# Patient Record
Sex: Female | Born: 1940
Health system: Southern US, Community
[De-identification: ages and names within clinical notes are randomized; demographics above are authoritative.]

## PROBLEM LIST (undated history)

## (undated) DIAGNOSIS — Z9889 Other specified postprocedural states: Secondary | ICD-10-CM

## (undated) DIAGNOSIS — F329 Major depressive disorder, single episode, unspecified: Secondary | ICD-10-CM

## (undated) DIAGNOSIS — G4730 Sleep apnea, unspecified: Secondary | ICD-10-CM

## (undated) DIAGNOSIS — K219 Gastro-esophageal reflux disease without esophagitis: Secondary | ICD-10-CM

## (undated) DIAGNOSIS — I10 Essential (primary) hypertension: Secondary | ICD-10-CM

## (undated) DIAGNOSIS — E119 Type 2 diabetes mellitus without complications: Secondary | ICD-10-CM

## (undated) DIAGNOSIS — M1990 Unspecified osteoarthritis, unspecified site: Secondary | ICD-10-CM

## (undated) DIAGNOSIS — R112 Nausea with vomiting, unspecified: Secondary | ICD-10-CM

## (undated) DIAGNOSIS — K5792 Diverticulitis of intestine, part unspecified, without perforation or abscess without bleeding: Secondary | ICD-10-CM

## (undated) DIAGNOSIS — Z87442 Personal history of urinary calculi: Secondary | ICD-10-CM

## (undated) DIAGNOSIS — F32A Depression, unspecified: Secondary | ICD-10-CM

## (undated) DIAGNOSIS — F419 Anxiety disorder, unspecified: Secondary | ICD-10-CM

## (undated) HISTORY — PX: ABDOMINAL HYSTERECTOMY: SHX81

## (undated) HISTORY — PX: COLONOSCOPY: SHX174

## (undated) HISTORY — PX: TONSILLECTOMY: SUR1361

## (undated) HISTORY — PX: KNEE ARTHROSCOPY: SUR90

## (undated) HISTORY — PX: CHOLECYSTECTOMY: SHX55

---

## 2018-03-07 ENCOUNTER — Other Ambulatory Visit: Payer: Self-pay | Admitting: Neurosurgery

## 2018-03-07 DIAGNOSIS — M4316 Spondylolisthesis, lumbar region: Secondary | ICD-10-CM

## 2018-03-22 ENCOUNTER — Ambulatory Visit
Admission: RE | Admit: 2018-03-22 | Discharge: 2018-03-22 | Disposition: A | Discharge status: Home / Self Care | Payer: 59 | Source: Ambulatory Visit | Attending: Neurosurgery | Admitting: Neurosurgery

## 2018-03-22 DIAGNOSIS — M4316 Spondylolisthesis, lumbar region: Secondary | ICD-10-CM

## 2018-04-07 ENCOUNTER — Other Ambulatory Visit: Payer: Self-pay | Admitting: Neurosurgery

## 2018-05-01 ENCOUNTER — Other Ambulatory Visit: Payer: Self-pay

## 2018-05-01 ENCOUNTER — Encounter (HOSPITAL_COMMUNITY): Payer: Self-pay | Admitting: Physician Assistant

## 2018-05-01 ENCOUNTER — Encounter (HOSPITAL_COMMUNITY): Payer: Self-pay | Admitting: Emergency Medicine

## 2018-05-01 ENCOUNTER — Encounter (HOSPITAL_COMMUNITY): Payer: Self-pay

## 2018-05-01 ENCOUNTER — Emergency Department (HOSPITAL_COMMUNITY): Payer: Medicare HMO

## 2018-05-01 ENCOUNTER — Encounter (HOSPITAL_COMMUNITY)
Admission: RE | Admit: 2018-05-01 | Discharge: 2018-05-01 | Disposition: A | Discharge status: Home / Self Care | Payer: Medicare HMO | Source: Ambulatory Visit | Attending: Neurosurgery | Admitting: Neurosurgery

## 2018-05-01 ENCOUNTER — Inpatient Hospital Stay (HOSPITAL_COMMUNITY)
Admission: EM | Admit: 2018-05-01 | Discharge: 2018-05-03 | DRG: 310 | Disposition: A | Discharge status: Home / Self Care | Payer: Medicare HMO | Attending: Family Medicine | Admitting: Family Medicine

## 2018-05-01 DIAGNOSIS — F329 Major depressive disorder, single episode, unspecified: Secondary | ICD-10-CM | POA: Diagnosis present

## 2018-05-01 DIAGNOSIS — I4891 Unspecified atrial fibrillation: Principal | ICD-10-CM | POA: Diagnosis present

## 2018-05-01 DIAGNOSIS — E785 Hyperlipidemia, unspecified: Secondary | ICD-10-CM | POA: Diagnosis present

## 2018-05-01 DIAGNOSIS — Z01818 Encounter for other preprocedural examination: Secondary | ICD-10-CM | POA: Insufficient documentation

## 2018-05-01 DIAGNOSIS — Z79899 Other long term (current) drug therapy: Secondary | ICD-10-CM | POA: Diagnosis not present

## 2018-05-01 DIAGNOSIS — M549 Dorsalgia, unspecified: Secondary | ICD-10-CM | POA: Diagnosis present

## 2018-05-01 DIAGNOSIS — I119 Hypertensive heart disease without heart failure: Secondary | ICD-10-CM | POA: Diagnosis present

## 2018-05-01 DIAGNOSIS — M1990 Unspecified osteoarthritis, unspecified site: Secondary | ICD-10-CM | POA: Diagnosis present

## 2018-05-01 DIAGNOSIS — Z8249 Family history of ischemic heart disease and other diseases of the circulatory system: Secondary | ICD-10-CM

## 2018-05-01 DIAGNOSIS — Z885 Allergy status to narcotic agent status: Secondary | ICD-10-CM

## 2018-05-01 DIAGNOSIS — K219 Gastro-esophageal reflux disease without esophagitis: Secondary | ICD-10-CM | POA: Diagnosis present

## 2018-05-01 DIAGNOSIS — Z6834 Body mass index (BMI) 34.0-34.9, adult: Secondary | ICD-10-CM | POA: Diagnosis not present

## 2018-05-01 DIAGNOSIS — E669 Obesity, unspecified: Secondary | ICD-10-CM | POA: Diagnosis present

## 2018-05-01 DIAGNOSIS — E876 Hypokalemia: Secondary | ICD-10-CM | POA: Diagnosis present

## 2018-05-01 DIAGNOSIS — F419 Anxiety disorder, unspecified: Secondary | ICD-10-CM | POA: Diagnosis present

## 2018-05-01 DIAGNOSIS — D72829 Elevated white blood cell count, unspecified: Secondary | ICD-10-CM | POA: Diagnosis present

## 2018-05-01 DIAGNOSIS — G4733 Obstructive sleep apnea (adult) (pediatric): Secondary | ICD-10-CM | POA: Diagnosis present

## 2018-05-01 DIAGNOSIS — Z87442 Personal history of urinary calculi: Secondary | ICD-10-CM

## 2018-05-01 DIAGNOSIS — E119 Type 2 diabetes mellitus without complications: Secondary | ICD-10-CM | POA: Insufficient documentation

## 2018-05-01 DIAGNOSIS — E1165 Type 2 diabetes mellitus with hyperglycemia: Secondary | ICD-10-CM | POA: Diagnosis present

## 2018-05-01 DIAGNOSIS — Z794 Long term (current) use of insulin: Secondary | ICD-10-CM | POA: Diagnosis not present

## 2018-05-01 DIAGNOSIS — Z23 Encounter for immunization: Secondary | ICD-10-CM | POA: Diagnosis present

## 2018-05-01 DIAGNOSIS — I10 Essential (primary) hypertension: Secondary | ICD-10-CM

## 2018-05-01 HISTORY — DX: Gastro-esophageal reflux disease without esophagitis: K21.9

## 2018-05-01 HISTORY — DX: Essential (primary) hypertension: I10

## 2018-05-01 HISTORY — DX: Type 2 diabetes mellitus without complications: E11.9

## 2018-05-01 HISTORY — DX: Sleep apnea, unspecified: G47.30

## 2018-05-01 HISTORY — DX: Anxiety disorder, unspecified: F41.9

## 2018-05-01 HISTORY — DX: Unspecified osteoarthritis, unspecified site: M19.90

## 2018-05-01 HISTORY — DX: Major depressive disorder, single episode, unspecified: F32.9

## 2018-05-01 HISTORY — DX: Depression, unspecified: F32.A

## 2018-05-01 HISTORY — DX: Diverticulitis of intestine, part unspecified, without perforation or abscess without bleeding: K57.92

## 2018-05-01 HISTORY — DX: Personal history of urinary calculi: Z87.442

## 2018-05-01 LAB — BASIC METABOLIC PANEL
Anion gap: 13 (ref 5–15)
Anion gap: 13 (ref 5–15)
BUN: 11 mg/dL (ref 8–23)
BUN: 11 mg/dL (ref 8–23)
CALCIUM: 9.4 mg/dL (ref 8.9–10.3)
CHLORIDE: 100 mmol/L (ref 98–111)
CO2: 25 mmol/L (ref 22–32)
CO2: 23 mmol/L (ref 22–32)
Calcium: 9.6 mg/dL (ref 8.9–10.3)
Chloride: 100 mmol/L (ref 98–111)
Creatinine, Ser: 1 mg/dL (ref 0.44–1.00)
Creatinine, Ser: 0.89 mg/dL (ref 0.44–1.00)
GFR calc Af Amer: >60 mL/min (ref >60)
GFR calc Af Amer: >60 mL/min (ref >60)
GFR calc non Af Amer: 55 mL/min — ABNORMAL LOW (ref >60)
GFR calc non Af Amer: >60 mL/min (ref >60)
Glucose, Bld: 313 mg/dL — ABNORMAL HIGH (ref 70–99)
Glucose, Bld: 312 mg/dL — ABNORMAL HIGH (ref 70–99)
Potassium: 3.3 mmol/L — ABNORMAL LOW (ref 3.5–5.1)
Potassium: 3.1 mmol/L — ABNORMAL LOW (ref 3.5–5.1)
Sodium: 138 mmol/L (ref 135–145)
Sodium: 136 mmol/L (ref 135–145)

## 2018-05-01 LAB — CBC
HCT: 50.2 % — ABNORMAL HIGH (ref 36.0–46.0)
HCT: 48.3 % — ABNORMAL HIGH (ref 36.0–46.0)
Hemoglobin: 16.1 g/dL — ABNORMAL HIGH (ref 12.0–15.0)
Hemoglobin: 15.9 g/dL — ABNORMAL HIGH (ref 12.0–15.0)
MCH: 29.6 pg (ref 26.0–34.0)
MCH: 29.8 pg (ref 26.0–34.0)
MCHC: 32.1 g/dL (ref 30.0–36.0)
MCHC: 32.9 g/dL (ref 30.0–36.0)
MCV: 92.3 fL (ref 80.0–100.0)
MCV: 90.4 fL (ref 80.0–100.0)
Platelets: 238 10*3/uL (ref 150–400)
Platelets: 236 10*3/uL (ref 150–400)
RBC: 5.44 MIL/uL — ABNORMAL HIGH (ref 3.87–5.11)
RBC: 5.34 MIL/uL — ABNORMAL HIGH (ref 3.87–5.11)
RDW: 12.4 % (ref 11.5–15.5)
RDW: 12.5 % (ref 11.5–15.5)
WBC: 18.5 10*3/uL — AB (ref 4.0–10.5)
WBC: 17.5 10*3/uL — ABNORMAL HIGH (ref 4.0–10.5)
nRBC: 0 % (ref 0.0–0.2)
nRBC: 0 % (ref 0.0–0.2)

## 2018-05-01 LAB — TYPE AND SCREEN
ABO/RH(D): A POS
Antibody Screen: NEGATIVE

## 2018-05-01 LAB — ABO/RH: ABO/RH(D): A POS

## 2018-05-01 LAB — SURGICAL PCR SCREEN
MRSA, PCR: NEGATIVE
Staphylococcus aureus: NEGATIVE

## 2018-05-01 LAB — GLUCOSE, CAPILLARY: Glucose-Capillary: 279 mg/dL — ABNORMAL HIGH (ref 70–99)

## 2018-05-01 LAB — I-STAT TROPONIN, ED: Troponin i, poc: 0.02 ng/mL (ref 0.00–0.08)

## 2018-05-01 LAB — HEMOGLOBIN A1C
Hgb A1c MFr Bld: 11.8 % — ABNORMAL HIGH (ref 4.8–5.6)
Mean Plasma Glucose: 291.96 mg/dL

## 2018-05-01 MED ORDER — APIXABAN 5 MG PO TABS
5.0000 mg | ORAL_TABLET | Freq: Two times a day (BID) | ORAL | Status: DC
  Administered 2018-05-02 – 2018-05-03 (×4): 5 mg via ORAL
  Filled 2018-05-01 (×5): qty 1

## 2018-05-01 MED ORDER — POTASSIUM CHLORIDE CRYS ER 20 MEQ PO TBCR
40.0000 meq | EXTENDED_RELEASE_TABLET | Freq: Once | ORAL | Status: AC
  Administered 2018-05-01: 40 meq via ORAL
  Filled 2018-05-01: qty 2

## 2018-05-01 MED ORDER — SODIUM CHLORIDE 0.9% FLUSH
3.0000 mL | Freq: Once | INTRAVENOUS | Status: AC
  Administered 2018-05-01: 3 mL via INTRAVENOUS

## 2018-05-01 MED ORDER — DILTIAZEM HCL 25 MG/5ML IV SOLN
10.0000 mg | Freq: Once | INTRAVENOUS | Status: AC
  Administered 2018-05-01: 10 mg via INTRAVENOUS
  Filled 2018-05-01: qty 5

## 2018-05-01 MED ORDER — DILTIAZEM HCL-DEXTROSE 100-5 MG/100ML-% IV SOLN (PREMIX)
5.0000 mg/h | INTRAVENOUS | Status: DC
  Administered 2018-05-01 – 2018-05-02 (×2): 5 mg/h via INTRAVENOUS
  Filled 2018-05-01 (×2): qty 100

## 2018-05-01 NOTE — Discharge Instructions (Addendum)
° °  For stress test, no caffeine for 48 hours prior to test,  nothing to eat or drink for 3 hours prior to study.  You will not walk for this test.       Information on my medicine - ELIQUIS (apixaban)  Why was Eliquis prescribed for you? Eliquis was prescribed for you to reduce the risk of a blood clot forming that can cause a stroke if you have a medical condition called atrial fibrillation (a type of irregular heartbeat).  What do You need to know about Eliquis ? Take your Eliquis TWICE DAILY - one tablet in the morning and one tablet in the evening with or without food. If you have difficulty swallowing the tablet whole please discuss with your pharmacist how to take the medication safely.  Take Eliquis exactly as prescribed by your doctor and DO NOT stop taking Eliquis without talking to the doctor who prescribed the medication.  Stopping may increase your risk of developing a stroke.  Refill your prescription before you run out.  After discharge, you should have regular check-up appointments with your healthcare provider that is prescribing your Eliquis.  In the future your dose may need to be changed if your kidney function or weight changes by a significant amount or as you get older.  What do you do if you miss a dose? If you miss a dose, take it as soon as you remember on the same day and resume taking twice daily.  Do not take more than one dose of ELIQUIS at the same time to make up a missed dose.  Important Safety Information A possible side effect of Eliquis is bleeding. You should call your healthcare provider right away if you experience any of the following: ? Bleeding from an injury or your nose that does not stop. ? Unusual colored urine (red or dark brown) or unusual colored stools (red or black). ? Unusual bruising for unknown reasons. ? A serious fall or if you hit your head (even if there is no bleeding).  Some medicines may interact with Eliquis and might  increase your risk of bleeding or clotting while on Eliquis. To help avoid this, consult your healthcare provider or pharmacist prior to using any new prescription or non-prescription medications, including herbals, vitamins, non-steroidal anti-inflammatory drugs (NSAIDs) and supplements.  This website has more information on Eliquis (apixaban): http://www.eliquis.com/eliquis/home

## 2018-05-01 NOTE — Progress Notes (Signed)
PCP - [name], NP at [facility] in [place], [place] Cardiologist - denies  Chest x-ray - n/a EKG - today  Stress Test - over 10 years ago (Dr. Rockne Menghini in [place]) will request records ECHO - over 10 years ago (will request records) Cardiac Cath - denies   Sleep Study - 5 years ago, some place in [place], [place] CPAP - states she can't tolerate a Cpap  Fasting Blood Sugar - approx 200 Checks Blood Sugar _2-3 ____ times a day Pt's CBG was 267, she ate lunch around 1:30 PM (had fries only).   Blood Thinner Instructions: N/a Aspirin Instructions: N/a  Anesthesia review: yes - when pt arrived in my office for her PAT appt she stated that she wasn't feeling well, stating that she was having abd pain. She states she's been having pain for a "day or two". States that the pain is low abd and she thinks she's been constipated the last few days. She states she did have a BM just prior to coming in for the appt. She states she is also nauseated. I asked her if she needed an emesis basis, but she stated that she didn't think so. I told her that I had a trash can if she needed it. Just a minute or so after telling her that, she requested it and she vomited several times, thick emesis. After vomiting, she stated she felt better and wanted to go on with the PAT interview. She continued to state that she felt better several times during the interview. Pt did say she got very "winded" and "worn out" walking from the parking deck. Finished the interview and pt states that the nausea was totally gone. EKG done and it showed A-fib. Pt states she has never been diagnosed with a-fib in the past. Antionette Poles, PA was notified and he saw pt at the bedside in the EKG room. Rapid response was notified and Verlon Au, RN came and transported pt to the ED.    Patient denies shortness of breath, fever, cough and chest pain at PAT appointment   Patient verbalized understanding of instructions that were given to them at the PAT  appointment. Patient was also instructed that they will need to review over the PAT instructions again at home before surgery.

## 2018-05-01 NOTE — Progress Notes (Signed)
ANTICOAGULATION CONSULT NOTE - Initial Consult  Pharmacy Consult for apixaban Indication: atrial fibrillation  Allergies  Allergen Reactions  . Codeine Nausea And Vomiting  . Propoxyphene Nausea And Vomiting    Darvocet    Patient Measurements: Wt: 93.4 kg Ht: 5'5"  Vital Signs: Temp: 98.7 F (37.1 C) (02/27 1647) Temp Source: Oral (02/27 1647) BP: 105/73 (02/27 2015) Pulse Rate: 78 (02/27 2015)  Labs: Recent Labs    05/01/18 1607 05/01/18 1657  HGB 16.1* 15.9*  HCT 50.2* 48.3*  PLT 238 236  CREATININE 1.00 0.89    Estimated Creatinine Clearance: 61.7 mL/min (by C-G formula based on SCr of 0.89 mg/dL).   Medical History: Past Medical History:  Diagnosis Date  . Anxiety   . Arthritis   . Depression   . Diabetes mellitus without complication (HCC)    type 2  . Diverticulitis   . GERD (gastroesophageal reflux disease)   . History of kidney stones   . Hypertension   . Sleep apnea    does not tolerate Cpap   Assessment: 77 yof found to be in new onset Afib during evaluation for preoperative clearance for lumbar surgery. No anticoag PTA.   Hgb 15.9, plt 236. Troponin 0.02. No s/sx of bleeding.  Will dose at full dose given age<80, wt>60 kg, and Scr<1.5.   Goal of Therapy:  Monitor platelets by anticoagulation protocol: Yes   Plan:  Apixaban 5 mg twice daily Monitor CBC and for s/sx of bleeding  Sherron Monday, PharmD, BCCCP Clinical Pharmacist  Pager: [phone] Phone: [phone] 05/01/2018,8:23 PM

## 2018-05-01 NOTE — Progress Notes (Signed)
Called to see pt as she reported not feeling well and had an episode of emesis in PAT. She has been having recent abdominal pain. EKG revealed afib with RVR ventricular rate ~119 which is new for patient. She reports feeling fatigued and a little short of breath with walking. She denies any chest pain. She denies any cardiac history, says she had a stress test ~15yrs ago in [place] that was normal. Cardiology was called and patient was transport to ED for further workup and management.   Zannie Cove [facility] Short Stay Center/Anesthesiology Phone [phone] 05/01/2018 4:46 PM

## 2018-05-01 NOTE — Pre-Procedure Instructions (Signed)
Natasha Rosales  05/01/2018    Your procedure is scheduled on Thursday, May 09, 2018 at 12:15 PM.   Report to [facility] Entrance "A" Admitting Office at 10:15 AM.   Call this number if you have problems the morning of surgery: [phone]   Questions prior to day of surgery, please call [phone] between 8 & 4 PM.   Remember:  Do not eat or drink after midnight Wednesday, 05/08/18.  Take these medicines the morning of surgery with A SIP OF WATER: Amlodipine (Norvasc), Citalopram (Celexa), Lamotrigine (Lamictal), Pantoprazole (Protonix), Flonase - if needed  Do not use Aspirin products (BC powders, Goody's, etc), NSAIDS (Ibuprofen, Aleve, etc), Herbal medications or Multivitamins prior to surgery.  The evening before surgery and the morning of surgery, take 1/2 of your regular dose of Levemir Insulin. You will take 8 units.    How to Manage Your Diabetes Before Surgery   Why is it important to control my blood sugar before and after surgery?   Improving blood sugar levels before and after surgery helps healing and can limit problems.  A way of improving blood sugar control is eating a healthy diet by:  - Eating less sugar and carbohydrates  - Increasing activity/exercise  - Talk with your doctor about reaching your blood sugar goals  High blood sugars (greater than 180 mg/dL) can raise your risk of infections and slow down your recovery so you will need to focus on controlling your diabetes during the weeks before surgery.  Make sure that the doctor who takes care of your diabetes knows about your planned surgery including the date and location.  How do I manage my blood sugars before surgery?   Check your blood sugar at least 4 times a day, 2 days before surgery to make sure that they are not too high or low.  Check your blood sugar the morning of your surgery when you wake up and every 2 hours until you get to the Short-Stay unit.  Treat a low blood sugar  (less than 70 mg/dL) with 1/2 cup of clear juice (cranberry or apple), 4 glucose tablets, OR glucose gel.  Recheck blood sugar in 15 minutes after treatment (to make sure it is greater than 70 mg/dL).  If blood sugar is not greater than 70 mg/dL on re-check, call [phone] for further instructions.   Report your blood sugar to the Short-Stay nurse when you get to Short-Stay.  References:  [facility], 2007 "How to Manage your Diabetes Before and After Surgery".     Do not wear jewelry, make-up or nail polish.  Do not wear lotions, powders, perfumes or deodorant.  Do not shave 48 hours prior to surgery.  Men may shave face and neck.  Do not bring valuables to the hospital.  [facility] is not responsible for any belongings or valuables.  Contacts, dentures or bridgework may not be worn into surgery.  Leave your suitcase in the car.  After surgery it may be brought to your room.  For patients admitted to the hospital, discharge time will be determined by your treatment team.  [facility] - Preparing for Surgery  Before surgery, you can play an important role.  Because skin is not sterile, your skin needs to be as free of germs as possible.  You can reduce the number of germs on you skin by washing with CHG (chlorahexidine gluconate) soap before surgery.  CHG is an antiseptic cleaner which kills germs and  bonds with the skin to continue killing germs even after washing.  Oral Hygiene is also important in reducing the risk of infection.  Remember to brush your teeth with your regular toothpaste the morning of surgery.  Please DO NOT use if you have an allergy to CHG or antibacterial soaps.  If your skin becomes reddened/irritated stop using the CHG and inform your nurse when you arrive at Short Stay.  Do not shave (including legs and underarms) for at least 48 hours prior to the first CHG shower.  You may shave your face.  Please follow these instructions  carefully:   1.  Shower with CHG Soap the night before surgery and the morning of Surgery.  2.  If you choose to wash your hair, wash your hair first as usual with your normal shampoo.  3.  After you shampoo, rinse your hair and body thoroughly to remove the shampoo. 4.  Use CHG as you would any other liquid soap.  You can apply chg directly to the skin and wash gently with a      scrungie or washcloth.           5.  Apply the CHG Soap to your body ONLY FROM THE NECK DOWN.   Do not use on open wounds or open sores. Avoid contact with your eyes, ears, mouth and genitals (private parts).  Wash genitals (private parts) with your normal soap.  6.  Wash thoroughly, paying special attention to the area where your surgery will be performed.  7.  Thoroughly rinse your body with warm water from the neck down.  8.  DO NOT shower/wash with your normal soap after using and rinsing off the CHG Soap.  9.  Pat yourself dry with a clean towel.            10.  Wear clean pajamas.            11.  Place clean sheets on your bed the night of your first shower and do not sleep with pets.  Day of Surgery  Shower as above. Do not apply any lotions/deodorants the morning of surgery.   Please wear clean clothes to the hospital. Remember to brush your teeth with toothpaste.   Please read over the fact sheets that you were given.

## 2018-05-01 NOTE — Consult Note (Addendum)
Cardiology Consultation:   Patient ID: DERIONNA HUDMAN; [id number]; November 01, 1940   Admit date: 05/01/2018 Date of Consult: 05/01/2018  Primary Care Provider: Patient, No Pcp Per Primary Cardiologist: New to [facility]-Dr. Hilty  Patient Profile:   Natasha Rosales is a 78 y.o. female with a hx of anxiety, depression, DM 2, GERD and hypertension who is being seen today for the evaluation of new onset atrial fibrillation at the request of Dr. Rodena Medin.  History of Present Illness:   Natasha Rosales is a 78 year old female with a history stated above who presented to [facility] on 05/01/2018 after being evaluated during preoperative clearance for lumbar surgery with Dr. Venetia Maxon.  During work-up, she was found to be in new onset atrial fibrillation with heart rate up to the 130s.  She denies prior history of atrial fibrillation. She denies chest pain, shortness of breath or palpitations.  Given the above, she was sent to the emergency department for further evaluation.  She denies prior history of CAD although has a family hx of CAD in her son, sister and father. She denies tobacco, alcohol or illicit drug use.  Cardiology has been asked to consult given the above.  Past Medical History:  Diagnosis Date  . Anxiety   . Arthritis   . Depression   . Diabetes mellitus without complication (HCC)    type 2  . Diverticulitis   . GERD (gastroesophageal reflux disease)   . History of kidney stones   . Hypertension   . Sleep apnea    does not tolerate Cpap    Past Surgical History:  Procedure Laterality Date  . ABDOMINAL HYSTERECTOMY    . CHOLECYSTECTOMY    . COLONOSCOPY    . KNEE ARTHROSCOPY Left   . TONSILLECTOMY       Prior to Admission medications   Medication Sig Start Date End Date Taking? Authorizing Provider  amLODipine (NORVASC) 5 MG tablet Take 5 mg by mouth daily. 03/07/18   [provider]  citalopram (CELEXA) 40 MG tablet Take 40 mg by mouth daily. 03/07/18   [provider]    fluticasone (FLONASE) 50 MCG/ACT nasal spray Place 1 spray into both nostrils daily as needed for allergies. 11/27/17   [provider]  hydrochlorothiazide (HYDRODIURIL) 25 MG tablet Take 25 mg by mouth daily. 03/14/18   [provider]  lamoTRIgine (LAMICTAL) 25 MG tablet Take 25 mg by mouth 2 (two) times daily. 03/14/18   [provider]  LEVEMIR FLEXTOUCH 100 UNIT/ML Pen Inject 17 Units into the skin 2 (two) times daily. 03/12/18   [provider]  lisinopril (PRINIVIL,ZESTRIL) 40 MG tablet Take 40 mg by mouth daily. 03/18/18   [provider]  pantoprazole (PROTONIX) 40 MG tablet Take 40 mg by mouth daily before breakfast. 12/20/17   [provider]  Vitamin D, Ergocalciferol, (DRISDOL) 1.25 MG (50000 UT) CAPS capsule Take 50,000 Units by mouth 2 (two) times a week. Mondays & Fridays. 12/19/17   [provider]    Inpatient Medications: Scheduled Meds:  Continuous Infusions:  PRN Meds:   Allergies:    Allergies  Allergen Reactions  . Codeine Nausea And Vomiting  . Propoxyphene Nausea And Vomiting    Darvocet    Social History:   Social History   Socioeconomic History  . Marital status: Married    Spouse name: Not on file  . Number of children: Not on file  . Years of education: Not on file  . Highest education  level: Not on file  Occupational History  . Not on file  Social Needs  . Financial resource strain: Not on file  . Food insecurity:    Worry: Not on file    Inability: Not on file  . Transportation needs:    Medical: Not on file    Non-medical: Not on file  Tobacco Use  . Smoking status: Never Smoker  . Smokeless tobacco: Never Used  Substance and Sexual Activity  . Alcohol use: Never    Frequency: Never  . Drug use: Never  . Sexual activity: Not on file  Lifestyle  . Physical activity:    Days per week: Not on file    Minutes per session: Not on file  . Stress: Not on file  Relationships   . Social connections:    Talks on phone: Not on file    Gets together: Not on file    Attends religious service: Not on file    Active member of club or organization: Not on file    Attends meetings of clubs or organizations: Not on file    Relationship status: Not on file  . Intimate partner violence:    Fear of current or ex partner: Not on file    Emotionally abused: Not on file    Physically abused: Not on file    Forced sexual activity: Not on file  Other Topics Concern  . Not on file  Social History Narrative  . Not on file    Family History:   No family history on file. Family Status:  No family status information on file.    ROS:  Please see the history of present illness.  All other ROS reviewed and negative.     Physical Exam/Data:   Vitals:   05/01/18 1748 05/01/18 1800 05/01/18 1845 05/01/18 1900  BP: 122/69 112/79 (!) 114/98 114/79  Pulse: 98 98 (!) 101 (!) 124  Resp:  19 16 18   Temp:      TempSrc:      SpO2: 94% 92% 93% 94%   No intake or output data in the 24 hours ending 05/01/18 1937 There were no vitals filed for this visit. There is no height or weight on file to calculate BMI.   General: Well developed, well nourished, NAD Skin: Warm, dry, intact  Head: Normocephalic, atraumatic,  clear, moist mucus membranes. Neck: Negative for carotid bruits. No JVD Lungs:Clear to ausculation bilaterally. No wheezes, rales, or rhonchi. Breathing is unlabored. Cardiovascular: Irregularly irregular with S1 S2. No murmurs, rubs, gallops, or LV heave appreciated. Abdomen: Soft, non-tender, non-distended with normoactive bowel sounds. No obvious abdominal masses. MSK: Strength and tone appear normal for age. 5/5 in all extremities Extremities: No edema. No clubbing or cyanosis. DP/PT pulses 2+ bilaterally Neuro: Alert and oriented. No focal deficits. No facial asymmetry. [name] spontaneously. Psych: Responds to questions appropriately with normal affect.     EKG:   The EKG was personally reviewed and demonstrates: 05/01/2018 atrial fibrillation with heart rate 117 Telemetry:  Telemetry was personally reviewed and demonstrates: 05/01/2018 AF with HR 90-100's   Relevant CV Studies:  ECHO: None  CATH: None  Laboratory Data:  Chemistry Recent Labs  Lab 05/01/18 1607 05/01/18 1657  NA 138 136  K 3.3* 3.1*  CL 100 100  CO2 25 23  GLUCOSE 313* 312*  BUN 11 11  CREATININE 1.00 0.89  CALCIUM 9.6 9.4  GFRNONAA 55* >60  GFRAA >60 >60  ANIONGAP 13 13  No results found for: PROT, ALBUMIN, AST, ALT, ALKPHOS, BILITOT Hematology Recent Labs  Lab 05/01/18 1607 05/01/18 1657  WBC 18.5* 17.5*  RBC 5.44* 5.34*  HGB 16.1* 15.9*  HCT 50.2* 48.3*  MCV 92.3 90.4  MCH 29.6 29.8  MCHC 32.1 32.9  RDW 12.4 12.5  PLT 238 236   Cardiac EnzymesNo results for input(s): TROPONINI in the last 168 hours.  Recent Labs  Lab 05/01/18 1754  TROPIPOC 0.02    BNPNo results for input(s): BNP, PROBNP in the last 168 hours.  DDimer No results for input(s): DDIMER in the last 168 hours. TSH: No results found for: TSH Lipids:No results found for: CHOL, HDL, LDLCALC, LDLDIRECT, TRIG, CHOLHDL HgbA1c: Lab Results  Component Value Date   HGBA1C 11.8 (H) 05/01/2018    Radiology/Studies:  Dg Chest 2 View  Result Date: 05/01/2018 CLINICAL DATA:  Atrial fibrillation EXAM: CHEST - 2 VIEW COMPARISON:  None. FINDINGS: Mild cardiomegaly. No focal airspace opacities, effusions or edema. No acute bony abnormality. IMPRESSION: Cardiomegaly.  No active disease. Electronically Signed   By: Charlett Nose M.D.   On: 05/01/2018 18:39   Assessment and Plan:   1.  New onset atrial fibrillation: -Patient presented for preoperative evaluation for upcoming lumbar surgery with Dr. Venetia Maxon.  During evaluation, patient found to be in atrial fibrillation with heart rates as high as 130s.  She was asymptomatic with no chest pain, shortness of breath or palpitations.  She reports no  prior history of atrial fibrillation -Patient started on IV bolus diltiazem with subsequent diltiazem gtt. -Current heart rate in the 90-100's, asymptomatic  -Initial i-STAT troponin 0 0.02 likely in the setting of elevated heart rate and no anginal symptoms  -Continue IV diltiazem for rate control with BP parameters  -Will need to initiate anticoagulation given elevated CHA2DS2VASc score if patient remains in atrial fibrillation for greater than 48 hours -Denies prior history of hemorrhagic CVA or bleeding disorder -Echo to assess valve structure and LV function  -CHA2DS2VASc = 5 (age, sex, hypertension, DM2)  2.  Hypokalemia: -K+, 3.1 today -Replaced by ED MD  3.  DM2: -Uncontrolled, last hemoglobin A1c 11.8 today, 05/01/2018 -SSI for glucose control while inpatient status -Asking for nutrition consult while inpatient and possible referral to endocrinology in the OP setting    For questions or updates, please contact [facility] Please consult [email or web address] for contact info under Cardiology/STEMI.   SignedGeorgie Chard NP-[facility] Pager: [phone] 05/01/2018 7:37 PM

## 2018-05-01 NOTE — ED Triage Notes (Signed)
Pt here from pre-procedure labs and EKG and found to be in Afib RVR. Pt has no hx. Pt AO x 4. Denies other symptoms

## 2018-05-01 NOTE — ED Provider Notes (Signed)
[facility] EMERGENCY DEPARTMENT Provider Note   CSN: [id number] Arrival date & time: 05/01/18  1641    History   Chief Complaint Chief Complaint  Patient presents with  . Atrial Fibrillation    HPI CATALEIA NAWROT is a 78 y.o. female.     78 year old female with prior medical history as detailed below presents for evaluation of new onset atrial fibrillation.  Patient lives in [place].  Patient is here today for preop clearance for a lumbar surgery with Dr. Venetia Maxon.  At preop she was noted to be an atrial fibrillation with a heart rate up into the 130s.  She denies prior history of A. fib.  She denies associated chest pain or shortness of breath.  She denies palpitations.  She reports that after the A. fib was noticed in preop she was sent to the ED for further evaluation.  She denies prior history of cardiac disease.  She does report a negative stress test that was performed approximately 10 years ago.      The history is provided by the patient and medical records.  Atrial Fibrillation  This is a new problem. Episode onset: uncertain. The problem occurs constantly. The problem has not changed since onset.Pertinent negatives include no chest pain, no headaches and no shortness of breath. Nothing aggravates the symptoms. Nothing relieves the symptoms.    Past Medical History:  Diagnosis Date  . Anxiety   . Arthritis   . Depression   . Diabetes mellitus without complication (HCC)    type 2  . Diverticulitis   . GERD (gastroesophageal reflux disease)   . History of kidney stones   . Hypertension   . Sleep apnea    does not tolerate Cpap    There are no active problems to display for this patient.   Past Surgical History:  Procedure Laterality Date  . ABDOMINAL HYSTERECTOMY    . CHOLECYSTECTOMY    . COLONOSCOPY    . KNEE ARTHROSCOPY Left   . TONSILLECTOMY       OB History   No obstetric history on file.      Home Medications     Prior to Admission medications   Medication Sig Start Date End Date Taking? Authorizing Provider  amLODipine (NORVASC) 5 MG tablet Take 5 mg by mouth daily. 03/07/18   [provider]  citalopram (CELEXA) 40 MG tablet Take 40 mg by mouth daily. 03/07/18   [provider]  fluticasone (FLONASE) 50 MCG/ACT nasal spray Place 1 spray into both nostrils daily as needed for allergies. 11/27/17   [provider]  hydrochlorothiazide (HYDRODIURIL) 25 MG tablet Take 25 mg by mouth daily. 03/14/18   [provider]  lamoTRIgine (LAMICTAL) 25 MG tablet Take 25 mg by mouth 2 (two) times daily. 03/14/18   [provider]  LEVEMIR FLEXTOUCH 100 UNIT/ML Pen Inject 17 Units into the skin 2 (two) times daily. 03/12/18   [provider]  lisinopril (PRINIVIL,ZESTRIL) 40 MG tablet Take 40 mg by mouth daily. 03/18/18   [provider]  pantoprazole (PROTONIX) 40 MG tablet Take 40 mg by mouth daily before breakfast. 12/20/17   [provider]  Vitamin D, Ergocalciferol, (DRISDOL) 1.25 MG (50000 UT) CAPS capsule Take 50,000 Units by mouth 2 (two) times a week. Mondays & Fridays. 12/19/17   [provider]    Family History No family history on file.  Social History Social History   Tobacco Use  . Smoking status:  Never Smoker  . Smokeless tobacco: Never Used  Substance Use Topics  . Alcohol use: Never    Frequency: Never  . Drug use: Never     Allergies   Codeine and Propoxyphene   Review of Systems Review of Systems  Respiratory: Negative for shortness of breath.   Cardiovascular: Negative for chest pain.  Neurological: Negative for headaches.  All other systems reviewed and are negative.    Physical Exam Updated Vital Signs BP 114/79   Pulse (!) 124   Temp 98.7 F (37.1 C) (Oral)   Resp 18   SpO2 94%   Physical Exam Vitals signs and nursing note reviewed.  Constitutional:      General: She is not in acute  distress.    Appearance: She is well-developed.  HENT:     Head: Normocephalic and atraumatic.  Eyes:     Conjunctiva/sclera: Conjunctivae normal.     Pupils: Pupils are equal, round, and reactive to light.  Neck:     Musculoskeletal: Normal range of motion and neck supple.  Cardiovascular:     Rate and Rhythm: Tachycardia present. Rhythm irregular.     Heart sounds: Normal heart sounds.  Pulmonary:     Effort: Pulmonary effort is normal. No respiratory distress.     Breath sounds: Normal breath sounds.  Abdominal:     General: There is no distension.     Palpations: Abdomen is soft.     Tenderness: There is no abdominal tenderness.  Musculoskeletal: Normal range of motion.        General: No deformity.  Skin:    General: Skin is warm and dry.  Neurological:     Mental Status: She is alert and oriented to person, place, and time.      ED Treatments / Results  Labs (all labs ordered are listed, but only abnormal results are displayed) Labs Reviewed  BASIC METABOLIC PANEL - Abnormal; Notable for the following components:      Result Value   Potassium 3.1 (*)    Glucose, Bld 312 (*)    All other components within normal limits  CBC - Abnormal; Notable for the following components:   WBC 17.5 (*)    RBC 5.34 (*)    Hemoglobin 15.9 (*)    HCT 48.3 (*)    All other components within normal limits  I-STAT TROPONIN, ED    EKG None  Radiology Dg Chest 2 View  Result Date: 05/01/2018 CLINICAL DATA:  Atrial fibrillation EXAM: CHEST - 2 VIEW COMPARISON:  None. FINDINGS: Mild cardiomegaly. No focal airspace opacities, effusions or edema. No acute bony abnormality. IMPRESSION: Cardiomegaly.  No active disease. Electronically Signed   By: Charlett NoseKevin  Dover M.D.   On: 05/01/2018 18:39    Procedures Procedures (including critical care time) CRITICAL CARE Performed by: Wynetta FinesPeter C Messick   Total critical care time: 30 minutes  Critical care time was exclusive of separately  billable procedures and treating other patients.  Critical care was necessary to treat or prevent imminent or life-threatening deterioration.  Critical care was time spent personally by me on the following activities: development of treatment plan with patient and/or surrogate as well as nursing, discussions with consultants, evaluation of patient's response to treatment, examination of patient, obtaining history from patient or surrogate, ordering and performing treatments and interventions, ordering and review of laboratory studies, ordering and review of radiographic studies, pulse oximetry and re-evaluation of patient's condition.   Medications Ordered in ED Medications  sodium chloride  flush (NS) 0.9 % injection 3 mL (3 mLs Intravenous Given 05/01/18 1908)  potassium chloride SA (K-DUR,KLOR-CON) CR tablet 40 mEq (40 mEq Oral Given 05/01/18 1835)  diltiazem (CARDIZEM) injection 10 mg (10 mg Intravenous Given 05/01/18 1909)     Initial Impression / Assessment and Plan / ED Course  I have reviewed the triage vital signs and the nursing notes.  Pertinent labs & imaging results that were available during my care of the patient were reviewed by me and considered in my medical decision making (see chart for details).        CHA2DS2/VAS Stroke Risk Points  Current as of 2 minutes ago     5 >= 2 Points: High Risk  1 - 1.99 Points: Medium Risk  0 Points: Low Risk    The patient's score has not changed in the past year.:  No Change     Details    This score determines the patient's risk of having a stroke if the  patient has atrial fibrillation.       Points Metrics  0 Has Congestive Heart Failure:  No    Current as of 2 minutes ago  0 Has Vascular Disease:  No    Current as of 2 minutes ago  1 Has Hypertension:  Yes     Current as of 2 minutes ago  2 Age:  28    Current as of 2 minutes ago  1 Has Diabetes:  Yes     Current as of 2 minutes ago  0 Had Stroke:  No  Had TIA:  No   Had thromboembolism:  No    Current as of 2 minutes ago  1 Female:  Yes    Current as of 2 minutes ago     Score: 5        MDM  Screen complete  Patient is presenting for evaluation of new onset A. fib.  This was noted in a preoperative work-up.  Patient without prior history of same.  Screening labs do not reveal significant acute pathology.  Patient's heart rate initially in the ED is in the 110-120.  At time of admission her heart rate has increased slightly into the 120s.  Cardizem initiated to treat his RVR.  Case discussed with the medicine team who will evaluate for admission.  Cardiology consult placed.  Final Clinical Impressions(s) / ED Diagnoses   Final diagnoses:  Atrial fibrillation with RVR [facility])    ED Discharge Orders    None       Wynetta Fines, MD 05/01/18 [facility] [place]

## 2018-05-01 NOTE — H&P (Addendum)
Family Medicine Teaching [facility] Admission History and Physical Service Pager: [phone]  Patient name: Natasha Rosales Medical record number: [id number] Date of birth: [birth year] Age: 78 y.o. Gender: female  Primary Care Provider: Patient, No Pcp Per Consultants: Cardiology Code Status: Full  Chief Complaint: Incidental finding of A. fib  Assessment and Plan: Natasha Rosales is a 78 y.o. female presenting with A.fib as found during her pre-operative surgery appointment. PMH is significant for T2DM, HTN, Anxiety, Depression, Sleep Apnea, GERD, Hx Kidney Stones, Arthritis, and Diverticulitis.  New-onset A.fib with RVR: patient was seen for pre-operative visit for elective lumbar surgery and was found to be in A.fib with RVR on EKG (as well as possible inferior ischemia, QTc 441). She had been feeling dizzy, like she was going to pass out, and nauseous this whole week. She threw up during her appointment, so an EKG was performed and found the Afib. She has no history of an arrhythmia, MI, or CHF and denies light headedness, dizziness, headache, vision changes, chest pain, and shortness of breath. HR elevated to max of 124 while in ED. She was given  Cardizem in the ED and started on Diltiazem drip. Feeling of syncope/light headedness is most likely due to Afib but with abdominal pain also want to rule out MI. Unclear etiology at this time. No previous h/o cardiac disease. Can consider MI, will obtain troponin to rule this out. ISTAT troponin neg. Can consider cardiomyopathy, AS, MR, or CHF, will obtain echo to rule this out. Cardiomegaly noted in CXR which could be contributing. Cardiology consulted in ED. Given elevated CHADSVASC of 5 on presentation, decision to start anticoagulation with PO Eliquis was made. Rate control with CCB per cardiology.  -Admit to progressive, attending Dr. Deirdre Priest -Cardiology consulted in the ED, started Eliquis 5 and Dilt gtt - appreciate  recs -Echocardiogram 2/28 -AM EKG 2/28 -AM CBC, BMP, Mg, TSH -PT/OT - eval and treat -Ambulate with Assistance -Continuous cardiac, pulse ox monitoring -Tylenol PRN Pain -Trend Troponins -Vitals q4  T2DM: last Hgb 11.8% on 05/01/2018. Takes Levemir 17 units BID. -Levemir 8 once daily, can increase as needed. Patient reports she has not eaten all day so will not want to bottom out sugars overnight  -sSSI  Hypokalemia: 3.1 on admission, repleted with KDur. -AM BMP -replete as needed  HTN: BP on admission 115/83. Takes Lisinopril 40, HCTZ 25 daily, and Amlodipine  daily. -Diltiazem gtt per cardiology -Holding home HTN meds as to not cause Hypotension  Anxiety: Patient states she does not remember her medications but does know it is not celexa or lamictal. Was recently started on a new medication but cannot remember the name. Theatre manager. She used to take Abilify. Epic showing Lamictal  BID and Celexa  daily but patient denies taking either of them. -Hold until medications are reconciled. -call [facility] in AM to reconcile medications  GERD: patient has a history of GERD for which she takes Protonix  daily. -Protonix   FEN/GI: Carb-mod/Heart Healthy diet Prophylaxis: Eliquis  Disposition: Admit to progressive, Attending Dr. Deirdre Priest  History of Present Illness:  Natasha Rosales is a 78 y.o. female presenting with new-onset Afib with RVR found on EKG while doing pre-operative check up.  Patient states she has otherwise been at baseline but has been "sick to my stomach". Only episode of vomiting was today in pre-op. During emesis patient thought she was going to pass out, so EKG was performed. No chest pain, LH, or dizziness.  No previous cardiac history. Patient's father did have "heart out of rhythm" as well as MI history. Mother died at 46 y.o. with CHF. Patient's son with bypass surgery at 24 y.o. 2/2 clogged arteries found after MI.  Patient states sister has some unknown cardiac history that she takes medicine for.   Review Of Systems: Per HPI with the following additions:   Review of Systems  Eyes: Negative for blurred vision.  Respiratory: Negative for cough.   Cardiovascular: Negative for chest pain.  Gastrointestinal: Positive for abdominal pain, nausea and vomiting.   There are no active problems to display for this patient.  Past Medical History: Past Medical History:  Diagnosis Date  . Anxiety   . Arthritis   . Depression   . Diabetes mellitus without complication (HCC)    type 2  . Diverticulitis   . GERD (gastroesophageal reflux disease)   . History of kidney stones   . Hypertension   . Sleep apnea    does not tolerate Cpap   Past Surgical History: Past Surgical History:  Procedure Laterality Date  . ABDOMINAL HYSTERECTOMY    . CHOLECYSTECTOMY    . COLONOSCOPY    . KNEE ARTHROSCOPY Left   . TONSILLECTOMY     Social History: Social History   Tobacco Use  . Smoking status: Never Smoker  . Smokeless tobacco: Never Used  Substance Use Topics  . Alcohol use: Never    Frequency: Never  . Drug use: Never   Additional social history: denies tobacco use, denies alcohol use, denies drug use. Lives with husband and daughter (has parkinson's disease) Please also refer to relevant sections of EMR.  Family History: No family history on file. Daughter - has Parkinson's Dz Son - had MI with history of Bypass in his 48s  Allergies and Medications: Allergies  Allergen Reactions  . Codeine Nausea And Vomiting  . Propoxyphene Nausea And Vomiting    Darvocet   No current facility-administered medications on file prior to encounter.    Current Outpatient Medications on File Prior to Encounter  Medication Sig Dispense Refill  . amLODipine (NORVASC) 5 MG tablet Take 5 mg by mouth daily.    . citalopram (CELEXA) 40 MG tablet Take 40 mg by mouth daily.    . fluticasone (FLONASE) 50 MCG/ACT  nasal spray Place 1 spray into both nostrils daily as needed for allergies.    . hydrochlorothiazide (HYDRODIURIL) 25 MG tablet Take 25 mg by mouth daily.    Marland Kitchen lamoTRIgine (LAMICTAL) 25 MG tablet Take 25 mg by mouth 2 (two) times daily.    Marland Kitchen LEVEMIR FLEXTOUCH 100 UNIT/ML Pen Inject 17 Units into the skin 2 (two) times daily.    Marland Kitchen lisinopril (PRINIVIL,ZESTRIL) 40 MG tablet Take 40 mg by mouth daily.    . pantoprazole (PROTONIX) 40 MG tablet Take 40 mg by mouth daily before breakfast.    . Vitamin D, Ergocalciferol, (DRISDOL) 1.25 MG (50000 UT) CAPS capsule Take 50,000 Units by mouth 2 (two) times a week. Mondays & Fridays.     Objective: BP 114/79   Pulse (!) 124   Temp 98.7 F (37.1 C) (Oral)   Resp 18   SpO2 94%  Physical Exam Constitutional:      Appearance: She is obese.  Eyes:     Extraocular Movements: Extraocular movements intact.     Pupils: Pupils are equal, round, and reactive to light.  Cardiovascular:     Rate and Rhythm: Tachycardia present. Rhythm irregular.  Heart sounds: Normal heart sounds.     Comments: Tachycardia to 113, in Afib  Pulmonary:     Effort: Pulmonary effort is normal.     Breath sounds: Normal breath sounds.  Abdominal:     General: Bowel sounds are normal.  Musculoskeletal: Normal range of motion.  Neurological:     General: No focal deficit present.     Mental Status: She is alert and oriented to person, place, and time.   Labs and Imaging: CBC BMET  Recent Labs  Lab 05/01/18 1657  WBC 17.5*  HGB 15.9*  HCT 48.3*  PLT 236   Recent Labs  Lab 05/01/18 1657  NA 136  K 3.1*  CL 100  CO2 23  BUN 11  CREATININE 0.89  GLUCOSE 312*  CALCIUM 9.4     EKG: atrial fibrillation, rate 113, ischemic changes noted in inferior. QTC 441  Dg Chest 2 View  Result Date: 05/01/2018 CLINICAL DATA:  Atrial fibrillation EXAM: CHEST - 2 VIEW COMPARISON:  None. FINDINGS: Mild cardiomegaly. No focal airspace opacities, effusions or edema. No acute  bony abnormality. IMPRESSION: Cardiomegaly.  No active disease. Electronically Signed   By: Charlett Nose M.D.   On: 05/01/2018 18:39      Dollene Cleveland, DO 05/01/2018, 8:04 PM PGY-1, [facility] FPTS Intern pager: [phone], text pages welcome  FPTS Upper-Level Resident Addendum   I have independently interviewed and examined the patient. I have discussed the above with the original author and agree with their documentation. My edits for correction/addition/clarification are in blue. Please see also any attending notes.   Oralia Manis, DO PGY-2, [facility] 05/02/2018 12:00 AM  FPTS Service pager: [phone] (text pages welcome through [facility])

## 2018-05-01 NOTE — ED Notes (Signed)
Patient transported to X-ray 

## 2018-05-02 ENCOUNTER — Other Ambulatory Visit: Payer: Self-pay

## 2018-05-02 ENCOUNTER — Inpatient Hospital Stay (HOSPITAL_COMMUNITY): Payer: Medicare HMO

## 2018-05-02 ENCOUNTER — Encounter (HOSPITAL_COMMUNITY): Payer: Self-pay | Admitting: Cardiology

## 2018-05-02 ENCOUNTER — Other Ambulatory Visit: Payer: Self-pay | Admitting: Cardiology

## 2018-05-02 DIAGNOSIS — I4891 Unspecified atrial fibrillation: Secondary | ICD-10-CM

## 2018-05-02 DIAGNOSIS — I480 Paroxysmal atrial fibrillation: Secondary | ICD-10-CM

## 2018-05-02 LAB — LIPID PANEL
Cholesterol: 217 mg/dL — ABNORMAL HIGH (ref 0–200)
HDL: 26 mg/dL — ABNORMAL LOW (ref >40)
LDL Cholesterol: 154 mg/dL — ABNORMAL HIGH (ref 0–99)
Total CHOL/HDL Ratio: 8.3 RATIO
Triglycerides: 186 mg/dL — ABNORMAL HIGH (ref <150)
VLDL: 37 mg/dL (ref 0–40)

## 2018-05-02 LAB — TROPONIN I
Troponin I: <0.03 ng/mL (ref <0.03)
Troponin I: <0.03 ng/mL (ref <0.03)
Troponin I: <0.03 ng/mL (ref <0.03)

## 2018-05-02 LAB — CBC
HCT: 44.6 % (ref 36.0–46.0)
Hemoglobin: 14.8 g/dL (ref 12.0–15.0)
MCH: 29.8 pg (ref 26.0–34.0)
MCHC: 33.2 g/dL (ref 30.0–36.0)
MCV: 89.9 fL (ref 80.0–100.0)
Platelets: 220 10*3/uL (ref 150–400)
RBC: 4.96 MIL/uL (ref 3.87–5.11)
RDW: 12.7 % (ref 11.5–15.5)
WBC: 9.9 10*3/uL (ref 4.0–10.5)
nRBC: 0 % (ref 0.0–0.2)

## 2018-05-02 LAB — MAGNESIUM: Magnesium: 1.5 mg/dL — ABNORMAL LOW (ref 1.7–2.4)

## 2018-05-02 LAB — TSH: TSH: 1.942 u[IU]/mL (ref 0.350–4.500)

## 2018-05-02 LAB — BASIC METABOLIC PANEL
Anion gap: 10 (ref 5–15)
BUN: 9 mg/dL (ref 8–23)
CO2: 26 mmol/L (ref 22–32)
Calcium: 8.8 mg/dL — ABNORMAL LOW (ref 8.9–10.3)
Chloride: 99 mmol/L (ref 98–111)
Creatinine, Ser: 0.84 mg/dL (ref 0.44–1.00)
GFR calc Af Amer: >60 mL/min (ref >60)
GFR calc non Af Amer: >60 mL/min (ref >60)
Glucose, Bld: 310 mg/dL — ABNORMAL HIGH (ref 70–99)
POTASSIUM: 3.7 mmol/L (ref 3.5–5.1)
Sodium: 135 mmol/L (ref 135–145)

## 2018-05-02 LAB — URINALYSIS, ROUTINE W REFLEX MICROSCOPIC
Bilirubin Urine: NEGATIVE
Hgb urine dipstick: NEGATIVE
Ketones, ur: NEGATIVE mg/dL
Leukocytes,Ua: NEGATIVE
Nitrite: NEGATIVE
Protein, ur: NEGATIVE mg/dL
Specific Gravity, Urine: 1.023 (ref 1.005–1.030)
pH: 5 (ref 5.0–8.0)

## 2018-05-02 LAB — GLUCOSE, CAPILLARY
GLUCOSE-CAPILLARY: 240 mg/dL — AB (ref 70–99)
Glucose-Capillary: 289 mg/dL — ABNORMAL HIGH (ref 70–99)

## 2018-05-02 LAB — ECHOCARDIOGRAM COMPLETE
Height: 65 in
Weight: 3288 oz

## 2018-05-02 MED ORDER — PNEUMOCOCCAL VAC POLYVALENT 25 MCG/0.5ML IJ INJ
0.5000 mL | INJECTION | INTRAMUSCULAR | Status: AC
  Administered 2018-05-03: 0.5 mL via INTRAMUSCULAR
  Filled 2018-05-02: qty 0.5

## 2018-05-02 MED ORDER — INFLUENZA VAC SPLIT HIGH-DOSE 0.5 ML IM SUSY
0.5000 mL | PREFILLED_SYRINGE | INTRAMUSCULAR | Status: AC
  Administered 2018-05-03: 0.5 mL via INTRAMUSCULAR
  Filled 2018-05-02: qty 0.5

## 2018-05-02 MED ORDER — CITALOPRAM HYDROBROMIDE 20 MG PO TABS
40.0000 mg | ORAL_TABLET | Freq: Every day | ORAL | Status: DC
  Administered 2018-05-02 – 2018-05-03 (×2): 40 mg via ORAL
  Filled 2018-05-02 (×2): qty 2

## 2018-05-02 MED ORDER — INSULIN DETEMIR 100 UNIT/ML ~~LOC~~ SOLN
8.0000 [IU] | Freq: Every day | SUBCUTANEOUS | Status: DC
  Administered 2018-05-02 – 2018-05-03 (×2): 8 [IU] via SUBCUTANEOUS
  Filled 2018-05-02 (×2): qty 0.08

## 2018-05-02 MED ORDER — ACETAMINOPHEN 325 MG PO TABS: 650.0000 mg | ORAL_TABLET | ORAL | Status: DC | PRN

## 2018-05-02 MED ORDER — VITAMIN D (ERGOCALCIFEROL) 1.25 MG (50000 UNIT) PO CAPS: 50000.0000 [IU] | ORAL_CAPSULE | ORAL | Status: DC

## 2018-05-02 MED ORDER — PANTOPRAZOLE SODIUM 40 MG PO TBEC
40.0000 mg | DELAYED_RELEASE_TABLET | Freq: Every day | ORAL | Status: DC
  Administered 2018-05-02 – 2018-05-03 (×2): 40 mg via ORAL
  Filled 2018-05-02 (×2): qty 1

## 2018-05-02 MED ORDER — LAMOTRIGINE 25 MG PO TABS
25.0000 mg | ORAL_TABLET | Freq: Two times a day (BID) | ORAL | Status: DC
  Administered 2018-05-02 – 2018-05-03 (×2): 25 mg via ORAL
  Filled 2018-05-02 (×2): qty 1

## 2018-05-02 MED ORDER — MAGNESIUM SULFATE 2 GM/50ML IV SOLN
2.0000 g | Freq: Once | INTRAVENOUS | Status: AC
  Administered 2018-05-02: 2 g via INTRAVENOUS
  Filled 2018-05-02: qty 50

## 2018-05-02 MED ORDER — METOPROLOL TARTRATE 50 MG PO TABS
50.0000 mg | ORAL_TABLET | Freq: Two times a day (BID) | ORAL | Status: DC
  Administered 2018-05-02 – 2018-05-03 (×3): 50 mg via ORAL
  Filled 2018-05-02 (×3): qty 1

## 2018-05-02 MED ORDER — INSULIN ASPART 100 UNIT/ML ~~LOC~~ SOLN
0.0000 [IU] | Freq: Three times a day (TID) | SUBCUTANEOUS | Status: DC
  Administered 2018-05-02: 5 [IU] via SUBCUTANEOUS
  Administered 2018-05-02: 7 [IU] via SUBCUTANEOUS
  Administered 2018-05-02: 3 [IU] via SUBCUTANEOUS
  Administered 2018-05-03: 5 [IU] via SUBCUTANEOUS

## 2018-05-02 MED ORDER — PERFLUTREN LIPID MICROSPHERE
1.0000 mL | INTRAVENOUS | Status: AC | PRN
  Administered 2018-05-02 (×2): 2 mL via INTRAVENOUS
  Filled 2018-05-02: qty 10

## 2018-05-02 MED ORDER — HYDROCHLOROTHIAZIDE 25 MG PO TABS: 25.0000 mg | ORAL_TABLET | Freq: Every day | ORAL | Status: DC

## 2018-05-02 MED ORDER — LAMOTRIGINE 25 MG PO TABS: 25.0000 mg | ORAL_TABLET | Freq: Two times a day (BID) | ORAL | Status: DC

## 2018-05-02 MED ORDER — LISINOPRIL 40 MG PO TABS: 40.0000 mg | ORAL_TABLET | Freq: Every day | ORAL | Status: DC

## 2018-05-02 MED ORDER — APIXABAN 5 MG PO TABS: 5.0000 mg | ORAL_TABLET | Freq: Two times a day (BID) | ORAL | 0 refills | Status: DC

## 2018-05-02 MED FILL — ELIQUIS 5 MG TABLET: 5 | 30 days supply | Qty: 60 | Fill #0

## 2018-05-02 NOTE — Progress Notes (Signed)
Family Medicine Teaching Service Daily Progress Note Intern Pager: [phone]  Patient name: Natasha Rosales Medical record number: [id number] Date of birth: [birth year] Age: 78 y.o. Gender: female  Primary Care Provider: [name], NP Consultants: Cardiology Code Status: Full  Pt Overview and Major Events to Date:  05/01/2018 - Patient admitted for incidental finding of A. Fibrillation  Assessment and Plan: Natasha Rosales is a 78 y.o. female admitted for afib w/ RVR. Her chronic conditions include T2DM, HTN, Anxiety, Depression, OSA, GERD, Hx Kidney Stones, Arthritis, and Diverticulitis.   New-Onset Afib w/ RVR  Continues to be in afib, rate controlled to 80s. On Eliquis 5 and Metroprolol 50mg  BID (s/p dilt drip). Cardiology consulted and are considering cardioversion. Echo showing LFEV 60-65%, without diastolic/systolic dysfunction, atria normal sizes.  -Cardiology for cardioversion - to assess 05/03/2018 -Holding home amlodipine, lisinopril and HCTZ - might restart patient on Losartan outpatient as she has been having cough with Lisinopril -Continue eliquis 5mg  BID and Metoprolol 50 BID  Type 2 DM, chronic At home, on 17 units levemir BID. Started on levemir 8 units in the AM and sSSI TID. A1C 11.8.  -CBG TID WS + QHS -DC recs: closer f/u for glycemic control   Dyslipidemia, new: lipid panel obtained 2/28 showing cholesterol 217, trig 186, HDL low at 26 and LDL 154.  The 10-year ASCVD risk score Denman George DC Montez Hageman., et al., 2013) is: 30.3%   Values used to calculate the score:     Age: 21 years     Sex: Female     Is Non-Hispanic African American: No     Diabetic: Yes     Tobacco smoker: No     Systolic Blood Pressure: 114 mmHg     Is BP treated: Yes     HDL Cholesterol: 26 mg/dL     Total Cholesterol: 217 mg/dL -Start statin - Crestor 4, patient cannot recall being on one before  Hypokalemia + Hypomagnesemia, resolved K 3.1>4.7, Mg 1.5>1.8. S/p KDUR + 2g Magnesium on  admission.  -Monitor BMP    Leukocytosis  resolved  Ordered UA despite pt not having symptoms as she had a white count and the cause of rvr was still unknown. UA showing elevated Glucose, otherwise wnl w/o nitrites and LEs -Monitor for fever  HTN, chronic, stable Normotensive  -Now on Metoprolol 50 BID -Holding home amlodipine, lisinopril and HCTZ - might restart patient on losartan or other are both due to cough with lisinopril -K supplement at dc w/ these medications  Anxiety + Depression -Continue lamictal & celexa 40mg     Vitamin D -Continue home Vit D Mon and Thurs 50,000units   GERD, hx stricture s/p dilatation  -Continue home PTX  OSA  Does not tolerate CPAP  FEN/GI:  -Fluids: None  -Electrolytes: replete K, Mg PRN   -Nutrition: Heart Healthy with Carb Modified   -Access: Rt PIV  -VTE prophylaxis: Eliquis  Disposition: Pending cardioversion with Cardiology  Subjective:  Patient seen resting and relaxing in bed with husband at bedside.  States she has questions about the cardioversion is not sure if she wants to go through with it at this time.  Otherwise she denies lightheadedness, headaches, vision changes, chest pain, shortness of breath, and abdominal pains.  The overnight nurse reported the patient had, more specifically an episode or 2 of loose stools.  Patient did not mention such this morning.  Objective: Temp:  [97.6 F (36.4 C)-99.3 F (37.4 C)] 98.4 F (36.9 C) (02/28  2004) Pulse Rate:  [67-101] 81 (02/28 2120) Resp:  [17-20] 18 (02/28 2004) BP: (98-134)/(63-81) 118/68 (02/28 2004) SpO2:  [92 %-99 %] 93 % (02/28 2004) Weight:  [93.2 kg] 93.2 kg (02/28 0011) Physical Exam: General: NAD, resting in bed with husband at bedside Cardiovascular: Irregularly irregular rhythm, rate controlled in 80s, no murmurs, rubs, gallops Respiratory: CTA bilaterally Abdomen: soft, non-tender, bowel sounds normal in 4 quadrants Extremities: spontaneous  movement, ROM wnl, no evidence of edema or DVT  Laboratory: Recent Labs  Lab 05/01/18 1607 05/01/18 1657 05/02/18 0802  WBC 18.5* 17.5* 9.9  HGB 16.1* 15.9* 14.8  HCT 50.2* 48.3* 44.6  PLT 238 236 220   Recent Labs  Lab 05/01/18 1607 05/01/18 1657 05/02/18 0802  NA 138 136 135  K 3.3* 3.1* 3.7  CL 100 100 99  CO2 25 23 26   BUN 11 11 9   CREATININE 1.00 0.89 0.84  CALCIUM 9.6 9.4 8.8*  GLUCOSE 313* 312* 310*   Imaging/Diagnostic Tests: No new imaging.  Dollene Cleveland, DO 05/02/2018, 10:18 PM PGY-1, [facility] FPTS Intern pager: [phone], text pages welcome

## 2018-05-02 NOTE — Discharge Summary (Signed)
Family Medicine Teaching [facility] Discharge Summary  Patient name: Natasha Rosales Medical record number: [id number] Date of birth: [birth year] Age: 78 y.o. Gender: female Date of Admission: 05/01/2018  Date of Discharge: 05/03/2018 Admitting Physician: Carney Living, MD  Primary Care Provider: [name], NP Consultants: Cardiology  Indication for Hospitalization: A. fib with RVR  Discharge Diagnoses/Problem List:  A. fib with RVR, new onset Type 2 diabetes mellitus Hypertension Anxiety Depression Sleep apnea GERD History of kidney stones Arthritis Diverticulitis  Disposition: Discharge home  Discharge Condition: Improved, stable  Discharge Exam:  General: NAD, resting in bed with husband at bedside Cardiovascular: Irregularly irregular rhythm, rate controlled in 80s, no murmurs, rubs, gallops Respiratory: CTA bilaterally Abdomen: soft, non-tender, bowel sounds normal in 4 quadrants Extremities: spontaneous movement, ROM wnl, no evidence of edema or DVT  Brief Hospital Course:  Natasha Rosales is a 78 year old female who presented for pre-operative appointment for elective lumbar surgery, reported feeling dizzy like she was going to pass out threw up once, so EKG was performed and she was found to be in A. fib with RVR. Cardiology was consulted and patient was started on p.o. Eliquis 5 mg, as well as diltiazem drip. The diltiazem drip was transitioned to PO Metoprolol 50mg  BID.   The patient was found to be hypokalemic to 3.1 on admission.  This was repleted with K-Dur.  Cardiology was consulted and recommend Eliquis 5mg  for 3-4 weeks, follow up out patient, if still in A. Fib then can consider cardioversion.   Issues for Follow Up:  1. Patient's HbA1c this visit elevated to 11.8%.  Encourage dietary and lifestyle modifications to improve diabetes control. Patient remains on Lantus 17U BID. 2. The patient is currently unsure what medications she should be  taking for her anxiety. Please clarify outpatient. 3. Patient w/ previous hx of lisinopril allergy. Placed on ARB in the past, but pt unsure which one. Recommend starting ARB outpatient. 4. Lipid Panel showing dyslipidemia. ASCVD Risk 30%. Patient started on Crestor 40mg  daily.  Significant Procedures:  Echo 2/28: LVEF 55-60%, atria and ventricles normal size  Significant Labs and Imaging:  Recent Labs  Lab 05/01/18 1607 05/01/18 1657 05/02/18 0802  WBC 18.5* 17.5* 9.9  HGB 16.1* 15.9* 14.8  HCT 50.2* 48.3* 44.6  PLT 238 236 220   Recent Labs  Lab 05/01/18 1607 05/01/18 1657 05/02/18 0802 05/03/18 0440  NA 138 136 135 138  K 3.3* 3.1* 3.7 4.7  CL 100 100 99 99  CO2 25 23 26 29   GLUCOSE 313* 312* 310* 302*  BUN 11 11 9 13   CREATININE 1.00 0.89 0.84 0.97  CALCIUM 9.6 9.4 8.8* 9.3  MG  --   --  1.5* 1.8   Dg Chest 2 View  Result Date: 05/01/2018 CLINICAL DATA:  Atrial fibrillation EXAM: CHEST - 2 VIEW COMPARISON:  None. FINDINGS: Mild cardiomegaly. No focal airspace opacities, effusions or edema. No acute bony abnormality. IMPRESSION: Cardiomegaly.  No active disease. Electronically Signed   By: Charlett Nose M.D.   On: 05/01/2018 18:39   Results/Tests Pending at Time of Discharge: None  Discharge Medications:  Allergies as of 05/03/2018      Reactions   Codeine Nausea And Vomiting   Propoxyphene Nausea And Vomiting   Darvocet      Medication List    STOP taking these medications   amLODipine 5 MG tablet Commonly known as:  NORVASC   hydrochlorothiazide 25 MG tablet Commonly known as:  HYDRODIURIL  lisinopril 40 MG tablet Commonly known as:  PRINIVIL,ZESTRIL     TAKE these medications   apixaban 5 MG Tabs tablet Commonly known as:  ELIQUIS Take 1 tablet (5 mg total) by mouth 2 (two) times daily.   citalopram 40 MG tablet Commonly known as:  CELEXA Take 40 mg by mouth daily.   fluticasone 50 MCG/ACT nasal spray Commonly known as:  FLONASE Place 1  spray into both nostrils daily as needed for allergies.   lamoTRIgine 25 MG tablet Commonly known as:  LAMICTAL Take 25 mg by mouth 2 (two) times daily.   LEVEMIR FLEXTOUCH 100 UNIT/ML Pen Generic drug:  Insulin Detemir Inject 17 Units into the skin 2 (two) times daily. Morning and bedtime   metoprolol tartrate 50 MG tablet Commonly known as:  LOPRESSOR Take 1 tablet (50 mg total) by mouth 2 (two) times daily.   pantoprazole 40 MG tablet Commonly known as:  PROTONIX Take 40 mg by mouth daily before breakfast.   rosuvastatin 40 MG tablet Commonly known as:  CRESTOR Take 1 tablet (40 mg total) by mouth daily at 6 PM.   Vitamin D (Ergocalciferol) 1.25 MG (50000 UT) Caps capsule Commonly known as:  DRISDOL Take 50,000 Units by mouth 2 (two) times a week. Mondays & Fridays     Discharge Instructions: Please refer to Patient Instructions section of EMR for full details.  Patient was counseled important signs and symptoms that should prompt return to medical care, changes in medications, dietary instructions, activity restrictions, and follow up appointments.   Follow-Up Appointments: Follow-up Information    [facility] Follow up on 05/07/2018.   Specialty:  Cardiology Why:  at 7:45 AM   Contact information: [address] [place] [address] [phone]       Chrystie Nose, MD Follow up on 05/22/2018.   Specialty:  Cardiology Why:  at 9:00Am with his PA [facility] information: [address] [place] [address] [phone]          Dollene Cleveland, DO 05/03/2018, 8:43 AM PGY-1, [facility]

## 2018-05-02 NOTE — Care Management (Signed)
#   2.   [facility]  @ [facility] RX # [phone] OPT- MEMBER    1. ELIQUIS    5 MG BID COVER- YES CO-PAY- $ 47.00 TIER- 3 DRUG PRIOR APPROVAL- NO   2. ELIQUIS  2.5 MG BID  COVER- YES CO-PAY-  $ 47.00 TIER- 3 DRUG PRIOR APPROVAL- NO   PREFERRED PHARMACY : [facility]  RX M/O 90 DAY SUPPLY FOR M/O $ 141.00

## 2018-05-02 NOTE — Progress Notes (Signed)
Nutrition Education Note  RD consulted for nutrition education regarding a Diabetes / Heart Healthy diet.   RD provided "Heart Healthy Consistent Carbohydrate Nutrition Therapy" handout from the Academy of Nutrition and Dietetics.   Patient and husband requested to read over the information on their own.   Body mass index is 34.2 kg/m. Pt meets criteria for obesity based on current BMI.  Current diet order is heart healthy CHO modified, patient is consuming approximately 100% of meals at this time. Labs and medications reviewed. No further nutrition interventions warranted at this time. RD contact information provided. Recommend outpatient education at [facility].  Joaquin Courts, RD, LDN, CNSC Pager [phone] After Hours Pager [phone]

## 2018-05-02 NOTE — Progress Notes (Signed)
  Echocardiogram 2D Echocardiogram has been performed.  Shantasia Hunnell L Androw 05/02/2018, 3:42 PM

## 2018-05-02 NOTE — Evaluation (Addendum)
Physical Therapy Evaluation & Discharge Patient Details Name: Natasha Rosales MRN: [id number] DOB: 03-Jun-1940 Today's Date: 05/02/2018   History of Present Illness  Pt is a 78 y.o. female who presented for pre-operative appointment for elective lumbar sx and strated feeling dizzy and nauseous; admitted 05/01/18 for afib with RVR. PMH includes HTN, DM2, OSA, anxiety, arthritis.    Clinical Impression  Patient evaluated by Physical Therapy with no further acute PT needs identified. PTA, pt indep and lives with husband; increased difficulty with ambulation secondary to back and LE pain (was scheduled for elective lumbar sx next week). Today, pt indep with mobility. Initiated education regarding back precautions and importance of mobility post-op; recommend use of RW for added stability when legs are painful. All education has been completed and the patient has no further questions. Acute PT is signing off. Thank you for this referral.    Follow Up Recommendations No PT follow up    Equipment Recommendations  Rolling walker with 5" wheels    Recommendations for Other Services       Precautions / Restrictions Precautions Precautions: None Restrictions Weight Bearing Restrictions: No      Mobility  Bed Mobility Overal bed mobility: Independent                Transfers Overall transfer level: Independent                  Ambulation/Gait Ambulation/Gait assistance: Independent Gait Distance (Feet): 240 Feet Assistive device: IV Pole;None Gait Pattern/deviations: Step-through pattern;Decreased stride length;Antalgic Gait velocity: Decreased Gait velocity interpretation: 1.31 - 2.62 ft/sec, indicative of limited community ambulator General Gait Details: Slow, steady gait without device; indep ambulating with and without pushing IV pole  Stairs Stairs: (Educ on technique since LLE bothers pt more than RLE)          Wheelchair Mobility    Modified Rankin (Stroke  Patients Only)       Balance Overall balance assessment: No apparent balance deficits (not formally assessed);Needs assistance     Sitting balance - Comments: Able to cross RLE to don sock, had to bend down to L foot                                     Pertinent Vitals/Pain Pain Assessment: Faces Faces Pain Scale: Hurts a little bit Pain Location: Back into L leg Pain Descriptors / Indicators: Radiating Pain Intervention(s): Limited activity within patient's tolerance    Home Living Family/patient expects to be discharged to:: Private residence Living Arrangements: Spouse/significant other Available Help at Discharge: Family;Available 24 hours/day Type of Home: House       Home Layout: Two level;1/2 bath on main level;Bed/bath upstairs Home Equipment: Cane - single point      Prior Function Level of Independence: Independent         Comments: Indep, but limited by back pain radiating into legs (L>R); was scheduled for elective lumbar sx Friday 3/6     Hand Dominance        Extremity/Trunk Assessment   Upper Extremity Assessment Upper Extremity Assessment: Overall WFL for tasks assessed    Lower Extremity Assessment Lower Extremity Assessment: Overall WFL for tasks assessed       Communication   Communication: No difficulties  Cognition Arousal/Alertness: Awake/alert Behavior During Therapy: WFL for tasks assessed/performed Overall Cognitive Status: Within Functional Limits for tasks assessed  General Comments General comments (skin integrity, edema, etc.): Educ on back precautions with impending lower back sx    Exercises     Assessment/Plan    PT Assessment Patent does not need any further PT services  PT Problem List         PT Treatment Interventions      PT Goals (Current goals can be found in the Care Plan section)  Acute Rehab PT Goals PT Goal Formulation: All  assessment and education complete, DC therapy    Frequency     Barriers to discharge        Co-evaluation               AM-PAC PT "6 Clicks" Mobility  Outcome Measure Help needed turning from your back to your side while in a flat bed without using bedrails?: None Help needed moving from lying on your back to sitting on the side of a flat bed without using bedrails?: None Help needed moving to and from a bed to a chair (including a wheelchair)?: None Help needed standing up from a chair using your arms (e.g., wheelchair or bedside chair)?: None Help needed to walk in hospital room?: None Help needed climbing 3-5 steps with a railing? : None 6 Click Score: 24    End of Session   Activity Tolerance: Patient tolerated treatment well Patient left: in bed;with call bell/phone within reach;with family/visitor present Nurse Communication: Mobility status PT Visit Diagnosis: Other abnormalities of gait and mobility (R26.89)    Time: 0923-3007 PT Time Calculation (min) (ACUTE ONLY): 15 min   Charges:   PT Evaluation $PT Eval Low Complexity: 1 Low        Ina Homes, PT, DPT Acute Rehabilitation Services  Pager [phone] Office [phone]  Malachy Chamber 05/02/2018, 10:42 AM

## 2018-05-02 NOTE — Progress Notes (Signed)
Inpatient Diabetes Program Recommendations  AACE/ADA: New Consensus Statement on Inpatient Glycemic Control (2015)  Target Ranges:  Prepandial:   less than 140 mg/dL      Peak postprandial:   less than 180 mg/dL (1-2 hours)      Critically ill patients:  140 - 180 mg/dL   Lab Results  Component Value Date   GLUCAP 240 (H) 05/02/2018   HGBA1C 11.8 (H) 05/01/2018    Review of Glycemic Control  Diabetes history: DM2 Outpatient Diabetes medications: Levemir 17 units bid Current orders for Inpatient glycemic control: Levemir 8 units QD, Novolog 0-9 units tidwc  HgbA1C - 11.8%  Spoke with pt regarding HgbA1C of 11.8%. Pt states she thinks stress runs her blood sugars up and plans to f/u with PCP. States diet could be improved with portion sizes and reducing her sweets. Discussed importance of controlling blood sugars to prevent long-term complications. Pt checks blood sugars "several times/day."  Inpatient Diabetes Program Recommendations:    Increase Levemir to 16 units QD (1/2 home dose) Add Novolog 3 units tidwc for meal coverage insulin.  May need to be discharged on rapid-acting insulin, along with Levemir.  Will follow.  Thank you. Ailene Ards, RD, LDN, CDE Inpatient Diabetes Coordinator [phone]

## 2018-05-02 NOTE — Evaluation (Signed)
Occupational Therapy Evaluation Patient Details Name: Natasha Rosales MRN: [id number] DOB: 10/12/1940 Today's Date: 05/02/2018    History of Present Illness Pt is a 78 y.o. female who presented for pre-operative appointment for elective lumbar sx and strated feeling dizzy and nauseous; admitted 05/01/18 for afib with RVR. PMH includes HTN, DM2, OSA, anxiety, arthritis.   Clinical Impression   Pt PTA living with spouse requiring minimal assist for LB ADL, but continued to do housework even though it aggravated her back. Pt currently limited mostly by low back pain and LLE with reduced AROM. Pt able to perform figure 4 technique with RLE, but unable with LLE. Pt performing UB ADL with Modified independence. Pt tolerating bed mobility with verbal cueing for proper technique. OTR providing energy conservation techniques for ADL and IADL tasks. Pt advised to buy reacher and cleaning utility with long handles. Pt does not require continued OT skilled services as pt is at her functional baseline. OT signing off.    Follow Up Recommendations  No OT follow up;Supervision - Intermittent    Equipment Recommendations  None recommended by OT    Recommendations for Other Services       Precautions / Restrictions Precautions Precautions: None Restrictions Weight Bearing Restrictions: No      Mobility Bed Mobility Overal bed mobility: Modified Independent(increased time)                Transfers Overall transfer level: Independent                    Balance Overall balance assessment: No apparent balance deficits (not formally assessed)     Sitting balance - Comments: figure four technique with RLE but requires assist with LLE.                                   ADL either performed or assessed with clinical judgement   ADL Overall ADL's : At baseline                                       General ADL Comments: Pt requires assist with LB ADL  mostly in LLE. Pt performing mobility with modified independence in room and bed mobility with verbal cueing to raise up towards head of bed.     Vision Baseline Vision/History: No visual deficits Vision Assessment?: No apparent visual deficits     Perception     Praxis      Pertinent Vitals/Pain Pain Assessment: 0-10 Pain Score: 2  Faces Pain Scale: Hurts a little bit Pain Location: Back into L leg Pain Descriptors / Indicators: Radiating Pain Intervention(s): Limited activity within patient's tolerance     Hand Dominance     Extremity/Trunk Assessment Upper Extremity Assessment Upper Extremity Assessment: Overall WFL for tasks assessed   Lower Extremity Assessment Lower Extremity Assessment: Overall WFL for tasks assessed   Cervical / Trunk Assessment Cervical / Trunk Assessment: Normal   Communication Communication Communication: No difficulties   Cognition Arousal/Alertness: Awake/alert Behavior During Therapy: WFL for tasks assessed/performed Overall Cognitive Status: Within Functional Limits for tasks assessed                                     General Comments  education on AE  available and back precautions while performing ADL/IADL.    Exercises     Shoulder Instructions      Home Living Family/patient expects to be discharged to:: Private residence Living Arrangements: Spouse/significant other Available Help at Discharge: Family;Available 24 hours/day Type of Home: House       Home Layout: Two level;1/2 bath on main level;Bed/bath upstairs Alternate Level Stairs-Number of Steps: Flight Alternate Level Stairs-Rails: Right;Left Bathroom Shower/Tub: Chief Strategy Officer: Standard     Home Equipment: Cane - single point          Prior Functioning/Environment Level of Independence: Independent        Comments: Pt often limited by back pain radiating into BLEs L>R.        OT Problem List: Decreased  strength;Pain      OT Treatment/Interventions:      OT Goals(Current goals can be found in the care plan section)    OT Frequency:     Barriers to D/C:            Co-evaluation              AM-PAC OT "6 Clicks" Daily Activity     Outcome Measure Help from another person eating meals?: None Help from another person taking care of personal grooming?: None Help from another person toileting, which includes using toliet, bedpan, or urinal?: None Help from another person bathing (including washing, rinsing, drying)?: A Little Help from another person to put on and taking off regular upper body clothing?: None Help from another person to put on and taking off regular lower body clothing?: A Little 6 Click Score: 22   End of Session Nurse Communication: Mobility status  Activity Tolerance: Patient tolerated treatment well Patient left: in bed;with call bell/phone within reach;with family/visitor present  OT Visit Diagnosis: Muscle weakness (generalized) (M62.81)                Time: 1145-1200 OT Time Calculation (min): 15 min Charges:  OT General Charges $OT Visit: 1 Visit OT Evaluation $OT Eval Low Complexity: 1 Low  Cristi Loron) Glendell Docker OTR/L Acute Rehabilitation Services Pager: [phone] Office: [phone]   Sandrea Hughs 05/02/2018, 12:09 PM

## 2018-05-02 NOTE — Progress Notes (Addendum)
Progress Note  Patient Name: Natasha Rosales Date of Encounter: 05/02/2018  Primary Cardiologist: Chrystie Nose, MD new  Subjective   No chest pain no SOB  Inpatient Medications    Scheduled Meds: . apixaban  5 mg Oral BID  . insulin aspart  0-9 Units Subcutaneous TID WC  . insulin detemir  8 Units Subcutaneous Daily  . pantoprazole  40 mg Oral QAC breakfast   Continuous Infusions: . diltiazem (CARDIZEM) infusion 5 mg/hr (05/02/18 0300)   PRN Meds: acetaminophen   Vital Signs    Vitals:   05/02/18 0011 05/02/18 0014 05/02/18 0641 05/02/18 0754  BP:  117/74 122/81 119/74  Pulse:  74 (!) 101 75  Resp:    20  Temp:  97.7 F (36.5 C) 98 F (36.7 C) 97.8 F (36.6 C)  TempSrc:  Oral Oral Oral  SpO2:  97% 94% 96%  Weight: 93.2 kg     Height: 5\' 5"  (1.651 m)       Intake/Output Summary (Last 24 hours) at 05/02/2018 1115 Last data filed at 05/02/2018 1022 Gross per 24 hour  Intake 427.26 ml  Output -  Net 427.26 ml   Last 3 Weights 05/02/2018 05/01/2018  Weight (lbs) 205 lb 8 oz 206 lb  Weight (kg) 93.214 kg 93.441 kg      Telemetry    Atrial fib  - Personally Reviewed  ECG    A fib rate controlled at 89 with T wave inversions ant leads. - Personally Reviewed  Physical Exam   GEN: No acute distress.   Neck: No JVD Cardiac: irreg irreg, no murmurs, rubs, or gallops.  Respiratory: Clear to auscultation bilaterally. GI: Soft, nontender, non-distended  MS: No edema; No deformity. Neuro:  Nonfocal  Psych: Normal affect   Labs    Chemistry Recent Labs  Lab 05/01/18 1607 05/01/18 1657 05/02/18 0802  NA 138 136 135  K 3.3* 3.1* 3.7  CL 100 100 99  CO2 25 23 26   GLUCOSE 313* 312* 310*  BUN 11 11 9   CREATININE 1.00 0.89 0.84  CALCIUM 9.6 9.4 8.8*  GFRNONAA 55* >60 >60  GFRAA >60 >60 >60  ANIONGAP 13 13 10      Hematology Recent Labs  Lab 05/01/18 1607 05/01/18 1657 05/02/18 0802  WBC 18.5* 17.5* 9.9  RBC 5.44* 5.34* 4.96  HGB 16.1*  15.9* 14.8  HCT 50.2* 48.3* 44.6  MCV 92.3 90.4 89.9  MCH 29.6 29.8 29.8  MCHC 32.1 32.9 33.2  RDW 12.4 12.5 12.7  PLT 238 236 220    Cardiac Enzymes Recent Labs  Lab 05/02/18 0229 05/02/18 0802  TROPONINI <0.03 <0.03    Recent Labs  Lab 05/01/18 1754  TROPIPOC 0.02     BNPNo results for input(s): BNP, PROBNP in the last 168 hours.   DDimer No results for input(s): DDIMER in the last 168 hours.   Radiology    Dg Chest 2 View  Result Date: 05/01/2018 CLINICAL DATA:  Atrial fibrillation EXAM: CHEST - 2 VIEW COMPARISON:  None. FINDINGS: Mild cardiomegaly. No focal airspace opacities, effusions or edema. No acute bony abnormality. IMPRESSION: Cardiomegaly.  No active disease. Electronically Signed   By: Charlett Nose M.D.   On: 05/01/2018 18:39    Cardiac Studies   Echo scheduled  Patient Profile     78 y.o. female with a hx of anxiety, depression, DM 2, GERD and hypertension and admitted with new onset of a fib found on pre-op visit with Dr.  Stern.  HR up to 130s.   She an her husband have been married 60 years in October.  Assessment & Plan    A fib new rate controlled --pt asymptomatic. On IV dilt ? Change to po  CHA2DS2VASc = 5  --neg for MI troponin <0.03  --TSH is 1.942 --eliquis 5 mg BID  Hypokalemia replaced and K+ is 3.7 today.  Mg+ is 1.5 will replace.   DM-2 per IM team.  She would like dietician to give info on what she should eat.    Back pain, hold surgery for 2 months to allow for DCCV and 3-4 weeks post on anticoagulation.         For questions or updates, please contact [facility] Please consult [email or web address] for contact info under        Signed, Nada Boozer, NP  05/02/2018, 11:15 AM    I have seen and examined the patient along with Nada Boozer, NP.  I have reviewed the chart, notes and new data.  I agree with PA/NP's note.  Key new complaints: unaware of arrhythmia; forearm with IV hurts, but no obvious erythema or swelling. No  dyspnea or angina. Key examination changes: obese, irregular rhythm, no murmurs, no sign of hypervolemia. Key new findings / data: ECG AFib, widespread mild ST depression and T wave inversion. Severe hyperglycemia, poor chronic control (A1c 12%)  PLAN: Newly recognized atrial fibrillation, unclear chronicity, asymptomatic. Transition to beta blocker for rate control. Discussed purpose of anticoagulants and available choices, potential side effects. Eliquis is appropriate. She understands that if we plan cardioversion this will delay her spinal surgery for a couple of months (one month before and one month after the cardioversion), but prefers to have this done before spine surgery, which she believes is not urgent. This would also allow time for optimization of DM management and lessen the chance of infection/healing complications postop. Echo pending. If she has severe atrial dilation, will cancel plans for cardioversion.  Thurmon Fair, MD, [facility] [phone] 05/02/2018, 1:48 PM

## 2018-05-02 NOTE — Care Management Note (Signed)
Case Management Note  Patient Details  Name: Natasha Rosales MRN: [id number] Date of Birth: April 07, 1940  Subjective/Objective:  Pt presented for Atrial Fib. PTA from home with the support of spouse. Plan to return home once stable. PT recommendations for RW.                   Action/Plan: Pt is agreeable to DME RW. No further Home needs identified at this time. CM did provide patient with 30 day free Eliquis card.  Expected Discharge Date:                  Expected Discharge Plan:  Home/Self Care  In-House Referral:  Clinical Social Work  Discharge planning Services  CM Consult  Post Acute Care Choice:  Durable Medical Equipment Choice offered to:  Patient  DME Arranged:  Walker rolling DME Agency:     HH Arranged:  NA HH Agency:  NA  Status of Service:  Completed, signed off  If discussed at Long Length of Stay Meetings, dates discussed:    Additional Comments:  Gala Lewandowsky, RN 05/02/2018, 2:38 PM

## 2018-05-02 NOTE — Progress Notes (Addendum)
Family Medicine Teaching Service Daily Progress Note Intern Pager: [phone]  Patient name: Natasha Rosales Medical record number: [id number] Date of birth: February 12, 1941 Age: 78 y.o. Gender: female  Primary Care Provider: [name], NP Consultants:  Cardiology  Code Status: Full Code   Pt Overview and Major Events to Date:  Admitted: 05/01/2018 for CC: Atrial Fibrillation  Hospital Day: 2   Assessment and Plan: Natasha Rosales is a 78 y.o. female admitted for afib w/ RVR. Her chronic conditions include T2DM, HTN, Anxiety, Depression, OSA, GERD, Hx Kidney Stones, Arthritis, and Diverticulitis.   # New Onset Afib w/ RVR  Continues to be in afib today. Started on eliquis. Dilt drip discontinued and patient started on metoprolol 50mg  BID. Cardiology consulted and will do cardioversion. Echocardiogram obtained, 60-65%, without diastolic/systolic dysfunction.   Cardiology for cardioversion   Holding home amlodipine, lisinopril and HCTZ   Continue eliquis 5mg  BID   # Type 2 DM At home, on 17 units levemir BID. Started on levemir 8 units in the AM and sSSI TID. A1C 11.8.   Titrate insulin after first 24 hours  CBG TID WS + QHS  Obtain lipid panel - not on statin   DC recs: closer f/u for glycemic control   # Hypokalemia + Hypomagnesemia S/p KDUR + 2g Magnesium on admission.   Monitor BMP    #Leukocytosis  resolved  Ordered UA despite pt not having symptoms as she had a white count and the cause of rvr was still unknown.   F/u urine   Monitor for fever  # HTN Normotensive   Holding home amlodipine, lisinopril and HCTZ   K supplement at dc w/ these medications  # Anxiety + Depression  Continue lamictal & celexa 40mg     #Vitamin D  Continue home Vit D Mon and Thurs 50,000units   # GERD, hx stricture s/p dilatation   Continue home PTX  #OSA  Does not tolerate CPAP  #FEN/GI:  . Fluids: None  . Electrolytes: replete K, Mg PRN   . Nutrition: Heart  Healthy with Carb Modified    Access: Rt PIV  VTE prophylaxis: Eliquis  Disposition: Dispo pending cardioversion w/ cardiology      Subjective:  NAEO.   Objective: BP 134/79 (BP Location: Left Arm)   Pulse 71   Temp 97.6 F (36.4 C) (Oral)   Resp 18   Ht 5\' 5"  (1.651 m)   Wt 93.2 kg   SpO2 99%   BMI 34.20 kg/m  Intake/Output      02/27 0701 - 02/28 0700 02/28 0701 - 02/29 0700   P.O. 360 720   I.V. (mL/kg) 30.4 (0.3) 48.6 (0.5)   IV Piggyback  46.1   Total Intake(mL/kg) 390.4 (4.2) 814.7 (8.7)   Net +390.4 +814.7        Urine Occurrence 1 x 2 x     Physical Exam: Gen: NAD, alert, non-toxic, well-appearing, sitting comfortably  Skin: Warm and dry.  HEENT: NCAT.  MMM.  CV: 1/6 systolic murmur.  Normal S1-S2. No BLEE. Resp: CTAB. No increased WOB Abd: NTND. Pos bowel sounds Extremities: Warm and well perfused.   Laboratory: I have personally read and reviewed all labs and imaging studies.  Trending Labs: white count, K, Mg CBC: Recent Labs  Lab 05/01/18 1607 05/01/18 1657 05/02/18 0802  WBC 18.5* 17.5* 9.9  HGB 16.1* 15.9* 14.8  HCT 50.2* 48.3* 44.6  MCV 92.3 90.4 89.9  PLT 238 236 220   Basic Metabolic  Panel: Recent Labs  Lab 05/01/18 1607 05/01/18 1657 05/02/18 0802  NA 138 136 135  K 3.3* 3.1* 3.7  CL 100 100 99  CO2 25 23 26   GLUCOSE 313* 312* 310*  BUN 11 11 9   CREATININE 1.00 0.89 0.84  CALCIUM 9.6 9.4 8.8*  MG  --   --  1.5*   GFR: Estimated Creatinine Clearance: 65.3 mL/min (by C-G formula based on SCr of 0.84 mg/dL). Cardiac Enzymes: Recent Labs  Lab 05/02/18 0229 05/02/18 0802 05/02/18 1437  TROPONINI <0.03 <0.03 <0.03   HbA1C: Recent Labs    05/01/18 1607  HGBA1C 11.8*   CBG: Recent Labs  Lab 05/01/18 1515 05/02/18 0750 05/02/18 1157  GLUCAP 279* 289* 240*   Thyroid Function Tests: Recent Labs    05/02/18 0229  TSH 1.942   Urine analysis:    Component Value Date/Time   COLORURINE YELLOW 05/02/2018 0946    APPEARANCEUR HAZY (A) 05/02/2018 0946   LABSPEC 1.023 05/02/2018 0946   PHURINE 5.0 05/02/2018 0946   GLUCOSEU >=500 (A) 05/02/2018 0946   HGBUR NEGATIVE 05/02/2018 0946   BILIRUBINUR NEGATIVE 05/02/2018 0946   KETONESUR NEGATIVE 05/02/2018 0946   PROTEINUR NEGATIVE 05/02/2018 0946   NITRITE NEGATIVE 05/02/2018 0946   LEUKOCYTESUR NEGATIVE 05/02/2018 0946   Imaging/Diagnostic Tests: Dg Chest 2 View - Result Date: 05/01/2018  Cardiomegaly.  No active disease.    EKG Interpretation  Date/Time:  Thursday May 01 2018 16:45:27 EST Ventricular Rate:  113 PR Interval:    QRS Duration: 86 QT Interval:  322 QTC Calculation: 441 R Axis:   65 Text Interpretation:  Atrial fibrillation with rapid ventricular response T wave abnormality, consider inferior ischemia Abnormal ECG Confirmed by Sherryl Manges [phone]), editor Barbette Hair [phone]) on 05/02/2018 7:09:00 AM       Procedures:  05/02/18 Echo: 1. The left ventricle has normal systolic function, with an ejection fraction of 55-60%. The cavity size was normal. There is mildly increased left ventricular wall thickness. Left ventricular diastology could not be evaluated secondary to atrial  fibrillation. No evidence of left ventricular regional wall motion abnormalities.  Melene Plan, MD 05/02/2018, 4:50 PM PGY-1, [facility] Family Medicine FPTS Intern pager: [phone], text pages welcome

## 2018-05-03 LAB — BASIC METABOLIC PANEL
Anion gap: 10 (ref 5–15)
BUN: 13 mg/dL (ref 8–23)
CO2: 29 mmol/L (ref 22–32)
Calcium: 9.3 mg/dL (ref 8.9–10.3)
Chloride: 99 mmol/L (ref 98–111)
Creatinine, Ser: 0.97 mg/dL (ref 0.44–1.00)
GFR calc Af Amer: >60 mL/min (ref >60)
GFR calc non Af Amer: 57 mL/min — ABNORMAL LOW (ref >60)
GLUCOSE: 302 mg/dL — AB (ref 70–99)
Potassium: 4.7 mmol/L (ref 3.5–5.1)
Sodium: 138 mmol/L (ref 135–145)

## 2018-05-03 LAB — MAGNESIUM: Magnesium: 1.8 mg/dL (ref 1.7–2.4)

## 2018-05-03 MED ORDER — ROSUVASTATIN CALCIUM 40 MG PO TABS: 40.0000 mg | ORAL_TABLET | Freq: Every day | ORAL | 0 refills | Status: DC

## 2018-05-03 MED ORDER — ROSUVASTATIN CALCIUM 20 MG PO TABS: 40.0000 mg | ORAL_TABLET | Freq: Every day | ORAL | Status: DC

## 2018-05-03 MED ORDER — METOPROLOL TARTRATE 50 MG PO TABS: 50.0000 mg | ORAL_TABLET | Freq: Two times a day (BID) | ORAL | 0 refills | Status: DC

## 2018-05-03 NOTE — Progress Notes (Signed)
Discussed patient with primary team. Our plan would be cardioversion after 3-4 weeks of anticoagulation if she remains in afib, would not plan on inpatient cardioversion as she is asymptomatic and rate controlled. We will arrange outpatient f/u, continue eliquis 5mg  bid and lopressor 50mg  bid. Ok for discharge today, we will arrange outpatient f/u    Dina Rich MD

## 2018-05-05 LAB — GLUCOSE, CAPILLARY
Glucose-Capillary: 317 mg/dL — ABNORMAL HIGH (ref 70–99)
Glucose-Capillary: 210 mg/dL — ABNORMAL HIGH (ref 70–99)
Glucose-Capillary: 272 mg/dL — ABNORMAL HIGH (ref 70–99)

## 2018-05-07 ENCOUNTER — Telehealth (HOSPITAL_COMMUNITY): Payer: Self-pay | Admitting: *Deleted

## 2018-05-07 ENCOUNTER — Encounter (HOSPITAL_COMMUNITY): Payer: Medicare HMO

## 2018-05-07 NOTE — Telephone Encounter (Signed)
Patient given detailed instructions per Myocardial Perfusion Study Information Sheet for the test on 05/12/18. Patient notified to arrive 15 minutes early and that it is imperative to arrive on time for appointment to keep from having the test rescheduled.  If you need to cancel or reschedule your appointment, please call the office within 24 hours of your appointment. . Patient verbalized understanding. Sharece Fleischhacker Jacqueline    

## 2018-05-09 ENCOUNTER — Encounter (HOSPITAL_COMMUNITY): Admission: RE | Payer: Self-pay | Source: Home / Self Care

## 2018-05-09 ENCOUNTER — Inpatient Hospital Stay (HOSPITAL_COMMUNITY): Admission: RE | Admit: 2018-05-09 | Payer: Medicare HMO | Source: Home / Self Care | Admitting: Neurosurgery

## 2018-05-09 SURGERY — FOR MAXIMUM ACCESS (MAS) POSTERIOR LUMBAR INTERBODY FUSION (PLIF) 1 LEVEL
Anesthesia: General | Site: Back

## 2018-05-12 ENCOUNTER — Ambulatory Visit (HOSPITAL_COMMUNITY): Payer: Medicare HMO

## 2018-05-12 ENCOUNTER — Encounter (HOSPITAL_COMMUNITY): Payer: Medicare HMO

## 2018-05-13 ENCOUNTER — Telehealth (HOSPITAL_COMMUNITY): Payer: Self-pay | Admitting: *Deleted

## 2018-05-13 NOTE — Telephone Encounter (Signed)
Patient given detailed instructions per Myocardial Perfusion Study Information Sheet for the test on 05/19/18. Patient notified to arrive 15 minutes early and that it is imperative to arrive on time for appointment to keep from having the test rescheduled.  If you need to cancel or reschedule your appointment, please call the office within 24 hours of your appointment. . Patient verbalized understanding. Natasha Rosales    

## 2018-05-19 ENCOUNTER — Encounter (HOSPITAL_COMMUNITY): Payer: Medicare HMO

## 2018-05-21 ENCOUNTER — Encounter (HOSPITAL_COMMUNITY): Payer: Medicare HMO

## 2018-05-22 ENCOUNTER — Ambulatory Visit: Payer: Medicare HMO | Admitting: Cardiology

## 2018-06-09 ENCOUNTER — Other Ambulatory Visit: Payer: Self-pay | Admitting: Family Medicine

## 2018-07-23 ENCOUNTER — Telehealth (HOSPITAL_COMMUNITY): Payer: Self-pay

## 2018-07-23 ENCOUNTER — Other Ambulatory Visit: Payer: Self-pay | Admitting: Family Medicine

## 2018-07-23 NOTE — Telephone Encounter (Signed)
Encounter complete. 

## 2018-07-24 ENCOUNTER — Telehealth (HOSPITAL_COMMUNITY): Payer: Self-pay

## 2018-07-24 NOTE — Telephone Encounter (Signed)
Toy Cookey notified that patient needs to reschedule.  Encounter complete.

## 2018-07-25 ENCOUNTER — Inpatient Hospital Stay (HOSPITAL_COMMUNITY): Admission: RE | Admit: 2018-07-25 | Payer: Medicare HMO | Source: Ambulatory Visit

## 2018-07-29 ENCOUNTER — Telehealth: Payer: Self-pay | Admitting: Cardiology

## 2018-07-29 NOTE — Telephone Encounter (Signed)
New Message           Patient is needing to move her appointment out for about 2 weeks unfortunately there is nothing until 07/01/ and it blocked me and not sure what to do from here.

## 2018-07-29 NOTE — Telephone Encounter (Signed)
Attempted to contact pt. No answer and unable to leave message as mailbox is full.  

## 2018-07-30 ENCOUNTER — Inpatient Hospital Stay (HOSPITAL_COMMUNITY): Admission: RE | Admit: 2018-07-30 | Payer: Medicare HMO | Source: Ambulatory Visit

## 2018-07-30 NOTE — Telephone Encounter (Signed)
Spoke with pt, she was due for nuclear stress test and wanted to reschedule because she is congested and feels bad. Schedule is not open for nuclear studies at this time. Will forward to [facility] to contact the patient to reschedule. Pt agreed with this plan.

## 2018-07-31 ENCOUNTER — Encounter: Payer: Self-pay | Admitting: Cardiology

## 2018-08-18 ENCOUNTER — Telehealth: Payer: Self-pay | Admitting: Cardiology

## 2018-08-18 NOTE — Telephone Encounter (Signed)
New Message  Patient has order for myocardial profusion imaging that was cancelled. Patient would like to know if she can get this rescheduled. Please advise.

## 2018-09-02 ENCOUNTER — Other Ambulatory Visit: Payer: Self-pay

## 2018-09-02 DIAGNOSIS — I4891 Unspecified atrial fibrillation: Secondary | ICD-10-CM

## 2018-09-11 ENCOUNTER — Ambulatory Visit (HOSPITAL_COMMUNITY)
Admission: RE | Admit: 2018-09-11 | Payer: Medicare HMO | Source: Ambulatory Visit | Attending: Cardiology | Admitting: Cardiology

## 2018-10-07 ENCOUNTER — Inpatient Hospital Stay (HOSPITAL_COMMUNITY): Admission: RE | Admit: 2018-10-07 | Payer: Medicare HMO | Source: Ambulatory Visit

## 2018-10-08 ENCOUNTER — Telehealth (HOSPITAL_COMMUNITY): Payer: Self-pay

## 2018-10-08 NOTE — Telephone Encounter (Signed)
Encounter complete. 

## 2018-10-09 ENCOUNTER — Telehealth (HOSPITAL_COMMUNITY): Payer: Self-pay

## 2018-10-09 NOTE — Telephone Encounter (Signed)
Encounter complete. 

## 2018-10-10 ENCOUNTER — Ambulatory Visit (HOSPITAL_COMMUNITY)
Admission: RE | Admit: 2018-10-10 | Discharge: 2018-10-10 | Disposition: A | Discharge status: Home / Self Care | Payer: Medicare HMO | Source: Ambulatory Visit | Attending: Cardiovascular Disease | Admitting: Cardiovascular Disease

## 2018-10-14 ENCOUNTER — Telehealth (HOSPITAL_COMMUNITY): Payer: Self-pay

## 2018-10-14 NOTE — Telephone Encounter (Signed)
New message  Just an FYI. We have made several attempts to contact this patient including sending a letter to schedule or reschedule their [place] . We will be removing the patient from the [place].   8.7.20 no show  8.4.20 cancel by patient 7.9.20 cancel by patient  5.27.20 canel by patient 5.21.20 cancel by patient

## 2019-04-21 ENCOUNTER — Other Ambulatory Visit: Payer: Self-pay | Admitting: Neurosurgery

## 2019-04-21 DIAGNOSIS — R5381 Other malaise: Secondary | ICD-10-CM

## 2019-04-25 ENCOUNTER — Other Ambulatory Visit: Payer: Self-pay | Admitting: Neurosurgery

## 2019-04-25 DIAGNOSIS — M5416 Radiculopathy, lumbar region: Secondary | ICD-10-CM

## 2019-05-07 ENCOUNTER — Telehealth: Payer: Self-pay | Admitting: *Deleted

## 2019-05-07 NOTE — Telephone Encounter (Signed)
Our office received fax for referral to cardiology for A-fib, pt is on Eliquis. I will send message to Schedulers to reach out with New Pt appt for pre op as well as A-fib and on Eliquis. I will give the records to Greta Doom, CMA for chart prep.

## 2019-05-12 ENCOUNTER — Ambulatory Visit: Payer: Medicare HMO | Admitting: Internal Medicine

## 2019-05-15 ENCOUNTER — Ambulatory Visit: Payer: Medicare HMO | Admitting: Internal Medicine

## 2019-05-19 ENCOUNTER — Other Ambulatory Visit: Payer: Medicare HMO

## 2019-05-29 ENCOUNTER — Other Ambulatory Visit: Payer: Self-pay

## 2019-05-29 ENCOUNTER — Ambulatory Visit: Payer: Medicare HMO | Admitting: Internal Medicine

## 2019-05-29 ENCOUNTER — Encounter: Payer: Self-pay | Admitting: Internal Medicine

## 2019-05-29 VITALS — BP 122/78 | HR 121 | Temp 97.5°F | Ht 66.0 in | Wt 199.0 lb

## 2019-05-29 DIAGNOSIS — Z01810 Encounter for preprocedural cardiovascular examination: Secondary | ICD-10-CM

## 2019-05-29 DIAGNOSIS — I4891 Unspecified atrial fibrillation: Secondary | ICD-10-CM

## 2019-05-29 NOTE — Progress Notes (Signed)
OFFICE NOTE  Chief Complaint:  Preoperative clearance  Primary Care Physician: Natasha Jasmine, NP  HPI:  Natasha Rosales is a 79 y.o. female with a past medial history significant for anxiety, depression, DM2, GERD and HTN. She lives in [place] and was in town for pre-op for back surgery (February 2020) wiith Dr. Venetia Maxon. She was found to be incidentally in afib. Family history is significant for CAD in parents and her son, who had CABG in his 59's as well as her sister who has CAD. She reports she has had intermittent palpitations for years. EKG personally reviewed shows afib with RVR. She was given diltiazem with improvement in rate. Initial troponin is negative. She denies chest pain. Given her elevated CHADSVASC score of 5, would recommend starting Eliquis. Continue rate control with CCB.  Ultimately she was discharged but due to Covid never had surgery.  She now presents a year later and is status post her first of 2 COVID-19 vaccines.  She continues to have problems with her back and is inquiring whether or not she could undergo surgery.  EKG today shows that she is in atrial fibrillation at 101 with nonspecific T wave changes.  She says that a few months ago she saw her PCP and was noted to be in sinus rhythm.  She says at times she feels fatigued and it seems to correlate with her A. fib.  At other times when she is in rhythm she does not feel fatigued.  She is scheduled to have an upcoming lumbar spine MRI and her surgery is not yet scheduled.  She is remain on Eliquis 5 mg twice daily and uses metoprolol for rate control.  She has not had an ischemia evaluation.  PMHx:  Past Medical History:  Diagnosis Date  . Anxiety   . Arthritis   . Depression   . Diabetes mellitus without complication (HCC)    type 2  . Diverticulitis   . GERD (gastroesophageal reflux disease)   . History of kidney stones   . Hypertension   . Sleep apnea    does not tolerate Cpap    Past Surgical History:    Procedure Laterality Date  . ABDOMINAL HYSTERECTOMY    . CHOLECYSTECTOMY    . COLONOSCOPY    . KNEE ARTHROSCOPY Left   . TONSILLECTOMY      FAMHx:  No family history on file.  SOCHx:   reports that she has never smoked. She has never used smokeless tobacco. She reports that she does not drink alcohol or use drugs.  ALLERGIES:  Allergies  Allergen Reactions  . Codeine Nausea And Vomiting  . Propoxyphene Nausea And Vomiting    Darvocet    ROS: Pertinent items noted in HPI and remainder of comprehensive ROS otherwise negative.  HOME MEDS: Current Outpatient Medications on File Prior to Visit  Medication Sig Dispense Refill  . apixaban (ELIQUIS) 5 MG TABS tablet Take 1 tablet (5 mg total) by mouth 2 (two) times daily. 60 tablet 0  . citalopram (CELEXA) 40 MG tablet Take 40 mg by mouth daily.    . fluticasone (FLONASE) 50 MCG/ACT nasal spray Place 1 spray into both nostrils daily as needed for allergies.    Marland Kitchen lamoTRIgine (LAMICTAL) 25 MG tablet Take 25 mg by mouth 2 (two) times daily.    Marland Kitchen LEVEMIR FLEXTOUCH 100 UNIT/ML Pen Inject 17 Units into the skin 2 (two) times daily. Morning and bedtime    . metoprolol tartrate (LOPRESSOR) 50 MG  tablet Take 1 tablet (50 mg total) by mouth 2 (two) times daily. 60 tablet 0  . pantoprazole (PROTONIX) 40 MG tablet Take 40 mg by mouth daily before breakfast.    . rosuvastatin (CRESTOR) 40 MG tablet Take 1 tablet (40 mg total) by mouth daily at 6 PM. 30 tablet 0  . Vitamin D, Ergocalciferol, (DRISDOL) 1.25 MG (50000 UT) CAPS capsule Take 50,000 Units by mouth 2 (two) times a week. Mondays & Fridays     No current facility-administered medications on file prior to visit.    LABS/IMAGING: No results found for this or any previous visit (from the past 48 hour(s)). No results found.  LIPID PANEL:    Component Value Date/Time   CHOL 217 (H) 05/02/2018 1740   TRIG 186 (H) 05/02/2018 1740   HDL 26 (L) 05/02/2018 1740   CHOLHDL 8.3 05/02/2018  1740   VLDL 37 05/02/2018 1740   LDLCALC 154 (H) 05/02/2018 1740     WEIGHTS: Wt Readings from Last 3 Encounters:  05/29/19 199 lb (90.3 kg)  05/03/18 201 lb 6.4 oz (91.4 kg)  05/01/18 206 lb (93.4 kg)    VITALS: BP 122/78   Pulse (!) 121   Temp (!) 97.5 F (36.4 C)   Ht 5\' 6"  (1.676 m)   Wt 199 lb (90.3 kg)   SpO2 97%   BMI 32.12 kg/m   EXAM: General appearance: alert and no distress Neck: no carotid bruit, no JVD and thyroid not enlarged, symmetric, no tenderness/mass/nodules Lungs: clear to auscultation bilaterally Heart: irregularly irregular rhythm Abdomen: soft, non-tender; bowel sounds normal; no masses,  no organomegaly Extremities: extremities normal, atraumatic, no cyanosis or edema Pulses: 2+ and symmetric Skin: Skin color, texture, turgor normal. No rashes or lesions Neurologic: Grossly normal Psych: Pleasant  EKG: A. fib at 101, nonspecific T wave changes- personally reviewed  ASSESSMENT: 1. Paroxysmal atrial fibrillation with RVR-symptomatic 2. Indeterminate preoperative risk 3. Low back pain desiring surgery 4. Type 2 diabetes 5. Hypertension 6. OSA, not on CPAP   PLAN: 1.   [name] returns today now almost a year later after I saw her in the hospital for A. fib.  She is in A. fib today which is reasonably well rate controlled and has been on Eliquis consistently.  It does seem like she had some sinus rhythm per her report at her primary care doctor's office a few months ago.  This suggest that she may be paroxysmal but she does report feeling fatigued and short of breath which is associated with the A. fib.  I would like her to have an ischemia evaluation as a preoperative work-up and also to rule out ischemic cause of her A. fib.  If this is negative, would likely recommend antiarrhythmic therapy, perhaps Multaq to see if we can encourage her to be in sinus more than in A. fib.  I would continue her Eliquis until 3 days prior to surgery and  discontinue it.  Then she could undergo surgery with the plan to restart it as soon as practical afterwards.  I will plan to follow-up with her at that time.  If she persists in A. fib and has been on antiarrhythmic therapy, we could consider a cardioversion.  Follow-up with me in a few months.  Pixie Casino, MD, [facility], [place] Director of the [facility] Lipid Disorders &  Cardiovascular Risk Reduction Clinic Diplomate of the American Board of Clinical Lipidology Attending Cardiologist  Direct Dial: [phone]  Fax: [phone]  Website:  [email or web address] 05/29/2019, 9:47 PM

## 2019-05-29 NOTE — Patient Instructions (Signed)
Medication Instructions:  No changes *If you need a refill on your cardiac medications before your next appointment, please call your pharmacy*  Lab Work: None ordered this visit  Testing/Procedures: Your physician has requested that you have a lexiscan myoview. For further information please visit [email or web address] Please follow instruction sheet, as given.  Follow-Up: At [facility], you and your health needs are our priority.  As part of our continuing mission to provide you with exceptional heart care, we have created designated Provider Care Teams.  These Care Teams include your primary Cardiologist (physician) and Advanced Practice Providers (APPs -  Physician Assistants and Nurse Practitioners) who all work together to provide you with the care you need, when you need it.  We recommend signing up for the patient portal called "MyChart".  Sign up information is provided on this After Visit Summary.  MyChart is used to connect with patients for Virtual Visits (Telemedicine).  Patients are able to view lab/test results, encounter notes, upcoming appointments, etc.  Non-urgent messages can be sent to your provider as well.   To learn more about what you can do with MyChart, go to [email or web address].    Your next appointment:   1 month(s)  The format for your next appointment:   In Person  Provider:   K. Italy Hilty, MD

## 2019-06-05 ENCOUNTER — Telehealth (HOSPITAL_COMMUNITY): Payer: Self-pay

## 2019-06-05 NOTE — Telephone Encounter (Signed)
Encounter complete. 

## 2019-06-09 ENCOUNTER — Other Ambulatory Visit: Payer: Self-pay

## 2019-06-09 ENCOUNTER — Ambulatory Visit
Admission: RE | Admit: 2019-06-09 | Discharge: 2019-06-09 | Disposition: A | Discharge status: Home / Self Care | Payer: Medicare HMO | Source: Ambulatory Visit | Attending: Neurosurgery | Admitting: Neurosurgery

## 2019-06-09 DIAGNOSIS — M5416 Radiculopathy, lumbar region: Secondary | ICD-10-CM

## 2019-06-11 ENCOUNTER — Ambulatory Visit (HOSPITAL_COMMUNITY)
Admission: RE | Admit: 2019-06-11 | Payer: Medicare HMO | Source: Ambulatory Visit | Attending: Internal Medicine | Admitting: Internal Medicine

## 2019-06-17 ENCOUNTER — Telehealth (HOSPITAL_COMMUNITY): Payer: Self-pay

## 2019-06-17 NOTE — Telephone Encounter (Signed)
Encounter complete. 

## 2019-06-18 ENCOUNTER — Telehealth (HOSPITAL_COMMUNITY): Payer: Self-pay

## 2019-06-18 NOTE — Telephone Encounter (Signed)
Encounter complete. 

## 2019-06-19 ENCOUNTER — Other Ambulatory Visit: Payer: Self-pay

## 2019-06-19 ENCOUNTER — Ambulatory Visit (HOSPITAL_COMMUNITY)
Admission: RE | Admit: 2019-06-19 | Discharge: 2019-06-19 | Disposition: A | Discharge status: Home / Self Care | Payer: Medicare HMO | Source: Ambulatory Visit | Attending: Cardiovascular Disease | Admitting: Cardiovascular Disease

## 2019-06-19 DIAGNOSIS — Z01810 Encounter for preprocedural cardiovascular examination: Secondary | ICD-10-CM | POA: Diagnosis present

## 2019-06-19 DIAGNOSIS — I4891 Unspecified atrial fibrillation: Secondary | ICD-10-CM | POA: Diagnosis present

## 2019-06-19 LAB — MYOCARDIAL PERFUSION IMAGING
Peak HR: 134 {beats}/min
Rest HR: 113 {beats}/min
SDS: 2
SRS: 0
SSS: 2
TID: 0.95

## 2019-06-19 MED ORDER — TECHNETIUM TC 99M TETROFOSMIN IV KIT
10.5000 | PACK | Freq: Once | INTRAVENOUS | Status: AC | PRN
  Administered 2019-06-19: 10.5 via INTRAVENOUS
  Filled 2019-06-19: qty 11

## 2019-06-19 MED ORDER — TECHNETIUM TC 99M TETROFOSMIN IV KIT
32.3000 | PACK | Freq: Once | INTRAVENOUS | Status: AC | PRN
  Administered 2019-06-19: 32.3 via INTRAVENOUS
  Filled 2019-06-19: qty 33

## 2019-06-19 MED ORDER — REGADENOSON 0.4 MG/5ML IV SOLN
0.4000 mg | Freq: Once | INTRAVENOUS | Status: AC
  Administered 2019-06-19: 0.4 mg via INTRAVENOUS

## 2019-07-03 ENCOUNTER — Ambulatory Visit: Payer: Medicare HMO | Admitting: Internal Medicine

## 2019-07-07 ENCOUNTER — Other Ambulatory Visit: Payer: Self-pay | Admitting: Internal Medicine

## 2019-07-07 NOTE — Telephone Encounter (Signed)
*  STAT* If patient is at the pharmacy, call can be transferred to refill team.   1. Which medications need to be refilled? (please list name of each medication and dose if known)  Need a new prescription for Eliquis- need this today plea se  2. Which pharmacy/location (including street and city if local pharmacy) is medication to be sent to?  [phone]  3. Do they need a 30 day or 90 day supply? 90 days and refills

## 2019-07-08 ENCOUNTER — Telehealth: Payer: Self-pay | Admitting: Internal Medicine

## 2019-07-08 MED ORDER — APIXABAN 5 MG PO TABS: 5.0000 mg | ORAL_TABLET | Freq: Two times a day (BID) | ORAL | 0 refills | Status: AC

## 2019-07-08 NOTE — Telephone Encounter (Signed)
   [facility] Pre-operative Risk Assessment    Request for surgical clearance:  1. What type of surgery is being performed? L4-5 posterior lumbar interbody fusion   2. When is this surgery scheduled? TBD   3. What type of clearance is required (medical clearance vs. Pharmacy clearance to hold med vs. Both)? Both   4. Are there any medications that need to be held prior to surgery and how long? Eliquis   5. Practice name and name of physician performing surgery? Erline Levine, MD @ [facility]   6. What is your office phone number [phone]     7.   What is your office fax number [phone]  8.   Anesthesia type (None, local, MAC, general) ? General    Sheral Apley M 07/08/2019, 2:21 PM  _________________________________________________________________   (provider comments below)

## 2019-07-09 NOTE — Telephone Encounter (Signed)
   Primary Cardiologist: Chrystie Nose, MD  Chart reviewed as part of pre-operative protocol coverage. Patient was seen in the office with Dr. Rennis Golden 3/26 as part of pre op evaluation. Recommendation for stress testing which was completed and low risk. There was discussion in the notes regarding antiarrhythmic therapy if stress test was low risk.   Patient has a follow up appt on 5/12 with Judy Pimple. Further management of pre op work up can be addressed at this visit.  In regard to her Eliquis per Dr. Blanchie Dessert office note would plan to hold 3 days prior to surgery and then resume once appropriate from a surgical standpoint.  I will route this recommendation to the requesting party to inform of upcoming appt via Epic fax function and remove from pre-op pool.  Laverda Page, NP 07/09/2019, 10:28 AM

## 2019-07-15 ENCOUNTER — Telehealth (INDEPENDENT_AMBULATORY_CARE_PROVIDER_SITE_OTHER): Payer: Medicare HMO | Admitting: Medical

## 2019-07-15 ENCOUNTER — Encounter: Payer: Self-pay | Admitting: Medical

## 2019-07-15 VITALS — BP 130/79 | Ht 66.0 in | Wt 187.4 lb

## 2019-07-15 DIAGNOSIS — I480 Paroxysmal atrial fibrillation: Secondary | ICD-10-CM

## 2019-07-15 DIAGNOSIS — I10 Essential (primary) hypertension: Secondary | ICD-10-CM | POA: Diagnosis not present

## 2019-07-15 DIAGNOSIS — Z01810 Encounter for preprocedural cardiovascular examination: Secondary | ICD-10-CM

## 2019-07-15 DIAGNOSIS — E782 Mixed hyperlipidemia: Secondary | ICD-10-CM

## 2019-07-15 DIAGNOSIS — E1165 Type 2 diabetes mellitus with hyperglycemia: Secondary | ICD-10-CM

## 2019-07-15 MED ORDER — METOPROLOL TARTRATE 25 MG PO TABS
25.0000 mg | ORAL_TABLET | Freq: Two times a day (BID) | ORAL | 3 refills | Status: AC
Start: 2019-07-15 — End: ?

## 2019-07-15 NOTE — Patient Instructions (Signed)
Medication Instructions:   START Metoprolol tartrate 25 mg 2 times a day  *If you need a refill on your cardiac medications before your next appointment, please call your pharmacy*  Lab Work: NONE ordered at this time of appointment   If you have labs (blood work) drawn today and your tests are completely normal, you will receive your results only by: Marland Kitchen MyChart Message (if you have MyChart) OR . A paper copy in the mail If you have any lab test that is abnormal or we need to change your treatment, we will call you to review the results.  Testing/Procedures: NONE ordered at this time of appointment   Follow-Up: At [facility], you and your health needs are our priority.  As part of our continuing mission to provide you with exceptional heart care, we have created designated Provider Care Teams.  These Care Teams include your primary Cardiologist (physician) and Advanced Practice Providers (APPs -  Physician Assistants and Nurse Practitioners) who all work together to provide you with the care you need, when you need it.  We recommend signing up for the patient portal called "MyChart".  Sign up information is provided on this After Visit Summary.  MyChart is used to connect with patients for Virtual Visits (Telemedicine).  Patients are able to view lab/test results, encounter notes, upcoming appointments, etc.  Non-urgent messages can be sent to your provider as well.   To learn more about what you can do with MyChart, go to [email or web address].    Your next appointment:   2 month(s)  The format for your next appointment:   In Person  Provider:   K. Italy Hilty, MD  Other Instructions

## 2019-07-15 NOTE — Progress Notes (Signed)
Virtual Visit via Telephone Note   This visit type was conducted due to national recommendations for restrictions regarding the COVID-19 Pandemic (e.g. social distancing) in an effort to limit this patient's exposure and mitigate transmission in our community.  Due to her co-morbid illnesses, this patient is at least at moderate risk for complications without adequate follow up.  This format is felt to be most appropriate for this patient at this time.  The patient did not have access to video technology/had technical difficulties with video requiring transitioning to audio format only (telephone).  All issues noted in this document were discussed and addressed.  No physical exam could be performed with this format.  Please refer to the patient's chart for her  consent to telehealth for [facility].   The patient was identified using 2 identifiers.  Date:  07/16/2019   ID:  Natasha Rosales, DOB 04-Nov-1940, MRN [id number]  Patient Location: Home Provider Location: Office  PCP:  Allayne Butcher, NP  Cardiologist:  Pixie Casino, MD  Electrophysiologist:  None   Evaluation Performed:  Follow-Up Visit  Chief Complaint:  Preoperative evaluation  History of Present Illness:    Natasha Rosales is a 79 y.o. female with a PMH of paroxysmal atrial fibrillation, HTN, HLD, DM type 2, GERD, anxiety, and depression, who presents today for follow-up of her preoperative clearance.    She was last evaluated by cardiology at an outpatient visit with Dr. Debara Pickett 05/2019 for preop evaluation. She was dianosed with atrial fibrillation 04/2018, though had never completed an ischemic work-up. She underwent a NST 06/19/19 which revealed no ischemia. She continues to have some fatigue and is unsure of recurrent atrial fibrillation. She appears to have been in NSR at the time of her stress test.   She presents today for follow-up preoperative assessment. She continues to have some fatigue which is suspected to  correlate with her atrial fibrillation. She does not monitor her heart rate at home and is thinking of getting a watch device for closer monitoring. She is not on any rate controlling medications, though on prompting does remember being prescribed metoprolol in the past but is unsure when/why this medication was stopped. No complaints of bradycardia. She denies any chest pain or SOB. She is limited in activity by her back/leg pain and is hopeful her upcoming surgery will improve her symptoms. She denies any recent tachypalpitations. She denies dizziness, lightheadedness, or syncope. She reports her surgery was delayed in part due to her poorly controlled diabetes but reports her A1C has improved from 11 last year to 8 on labs in March, though these results are not available.   The patient does not have symptoms concerning for COVID-19 infection (fever, chills, cough, or new shortness of breath).    Past Medical History:  Diagnosis Date  . Anxiety   . Arthritis   . Depression   . Diabetes mellitus without complication ([place])    type 2  . Diverticulitis   . GERD (gastroesophageal reflux disease)   . History of kidney stones   . Hypertension   . Sleep apnea    does not tolerate Cpap   Past Surgical History:  Procedure Laterality Date  . ABDOMINAL HYSTERECTOMY    . CHOLECYSTECTOMY    . COLONOSCOPY    . KNEE ARTHROSCOPY Left   . TONSILLECTOMY       Current Meds  Medication Sig  . amLODipine (NORVASC) 5 MG tablet Take 5 mg by mouth daily.  Marland Kitchen  apixaban (ELIQUIS) 5 MG TABS tablet Take 1 tablet (5 mg total) by mouth 2 (two) times daily.  . citalopram (CELEXA) 40 MG tablet Take 40 mg by mouth daily.  . fluticasone (FLONASE) 50 MCG/ACT nasal spray Place 1 spray into both nostrils daily as needed for allergies.  . hydrochlorothiazide (HYDRODIURIL) 25 MG tablet Take 25 mg by mouth every morning.  Marland Kitchen HYDROcodone-acetaminophen (NORCO/VICODIN) 5-325 MG tablet Take 1 tablet by mouth every 6 (six)  hours as needed.  . lamoTRIgine (LAMICTAL) 25 MG tablet Take 25 mg by mouth 2 (two) times daily.  Marland Kitchen LEVEMIR FLEXTOUCH 100 UNIT/ML Pen Inject 17 Units into the skin 2 (two) times daily. Morning and bedtime  . losartan (COZAAR) 50 MG tablet Take 50 mg by mouth daily.  . metFORMIN (GLUCOPHAGE) 1000 MG tablet Take 1,000 mg by mouth 2 (two) times daily.  . pantoprazole (PROTONIX) 40 MG tablet Take 40 mg by mouth daily before breakfast.  . rosuvastatin (CRESTOR) 20 MG tablet Take 20 mg by mouth at bedtime.  . TRULICITY 0.75 MG/0.5ML SOPN SMARTSIG:0.5 Milliliter(s) SUB-Q Once a Week  . Vitamin D, Ergocalciferol, (DRISDOL) 1.25 MG (50000 UT) CAPS capsule Take 50,000 Units by mouth 2 (two) times a week. Mondays & Fridays     Allergies:   Codeine and Propoxyphene   Social History   Tobacco Use  . Smoking status: Never Smoker  . Smokeless tobacco: Never Used  Substance Use Topics  . Alcohol use: Never  . Drug use: Never     Family Hx: The patient's family history is not on file.  ROS:   Please see the history of present illness.     All other systems reviewed and are negative.   Prior CV studies:   The following studies were reviewed today:  Echocardiogram 04/2018: 1. The left ventricle has normal systolic function, with an ejection  fraction of 55-60%. The cavity size was normal. There is mildly increased  left ventricular wall thickness. Left ventricular diastology could not be  evaluated secondary to atrial  fibrillation. No evidence of left ventricular regional wall motion  abnormalities.  2. The right ventricle has normal systolic function. The cavity was  normal. There is no increase in right ventricular wall thickness.  3. The mitral valve is normal in structure.  4. The tricuspid valve is normal in structure.  5. The aortic valve is normal in structure. no stenosis of the aortic  valve.  6. The aortic root and ascending aorta are normal in size and structure.  7. No  evidence of left ventricular regional wall motion abnormalities.   SUMMARY    Technically difficult study. Definity contrast given. LVEF 60-65%,  mild LVH, normal wall motion, normal biatrial size, trivial MR, TR,  RSVP 21 mmHg, normal IVC   NST 06/2019:  EKG without ST changes  Normal perfusion No ischemia or scar.  Images not gated due to atrial fibrillation  Low risk study     Labs/Other Tests and Data Reviewed:    EKG:  No ECG reviewed.  Recent Labs: No results found for requested labs within last 8760 hours.   Recent Lipid Panel Lab Results  Component Value Date/Time   CHOL 217 (H) 05/02/2018 05:40 PM   TRIG 186 (H) 05/02/2018 05:40 PM   HDL 26 (L) 05/02/2018 05:40 PM   CHOLHDL 8.3 05/02/2018 05:40 PM   LDLCALC 154 (H) 05/02/2018 05:40 PM    Wt Readings from Last 3 Encounters:  07/15/19 187 lb 6.4 oz (  85 kg)  06/19/19 199 lb (90.3 kg)  05/29/19 199 lb (90.3 kg)     Objective:    Vital Signs:  BP 130/79   Ht 5\' 6"  (1.676 m)   Wt 187 lb 6.4 oz (85 kg)   BMI 30.25 kg/m    VITAL SIGNS:  reviewed GEN:  no acute distress RESPIRATORY:  speaking in full sentences without SOB CARDIOVASCULAR:  no peripheral edema NEURO:  A&O x3 PSYCH:  normal affect  ASSESSMENT & PLAN:    1. Preoperative assessment: patient is somewhat limited in activity by back pain. She underwent a NST 06/2019 to evaluate for ischemic etiology of her chest pain which was without ischemia.  - Based on ACC/AHA guidelines, Natasha Rosales would be at acceptable risk for the planned procedure without further cardiovascular testing.  - I will route this recommendation to the requesting party via Epic fax function  - Per Dr. Martie Round, patient can hold eliquis 3 days prior to her upcoming procedure with plans to restart as soon as she is cleared to do so by Dr. Rennis Golden.   2. Paroxysmal atrial fibrillation: likely still having intermittent episodes. No complaints of tachypalpitations. No bleeding on  eliquis. She is not on any rate controlling medications - Will start metoprolol 25mg  BID for rate control - Continue eliquis for stroke ppx.  - Will arrange follow-up with Dr. Venetia Maxon to discuss possible antiarrhythmic medications going forward.   3. HTN: BP 130/79 today. - Will start metoprolol as above for rate control - Continue amlodipine, hydrochlorothiazide, and losartan  4. HLD: no recent lipids on file - Continue crestor  5. DM type 2: reportedly A1C has improved from 11 04/2018 to 8 05/2019.  - Continue metformin and insulin   Time:   Today, I have spent 14 minutes with the patient with telehealth technology discussing the above problems.     Medication Adjustments/Labs and Tests Ordered: Current medicines are reviewed at length with the patient today.  Concerns regarding medicines are outlined above.   Tests Ordered: No orders of the defined types were placed in this encounter.   Medication Changes: Meds ordered this encounter  Medications  . metoprolol tartrate (LOPRESSOR) 25 MG tablet    Sig: Take 1 tablet (25 mg total) by mouth 2 (two) times daily.    Dispense:  180 tablet    Refill:  3    Follow Up:  In person with Dr. 05/2018 in 1-2 months  Signed, 06/2019, PA-C  07/16/2019 12:05 AM    [facility]

## 2019-07-16 ENCOUNTER — Other Ambulatory Visit: Payer: Self-pay | Admitting: Neurosurgery

## 2019-08-04 NOTE — Progress Notes (Signed)
Natasha Rosales, Natasha Rosales [address] Phone: [phone] Fax: [phone]  [facility] [place] [place] - [address] [place] [address] Phone: [phone] Fax: [phone]      Your procedure is scheduled on Friday 08/07/2019.  Report to [facility] Main Entrance "A" at 09:15 A.M., and check in at the Admitting office.  Call this number if you have problems the morning of surgery:  [phone]  Call [phone] if you have any questions prior to your surgery date Monday-Friday 8am-4pm    Remember:  Do not eat or drink after midnight the night before your surgery    Take these medicines the morning of surgery with A SIP OF WATER: Amlodipine (Norvasc) Citalopram (Celexa) Hydrocodone-acetaminophen (Norco/Vicodin) - if needed Lamotrigine (Lamictal) Pantoprazole (Protonix) Rosuvastatin (Crestor)  As of today, STOP taking any Aspirin (unless otherwise instructed by your surgeon) and Aspirin containing products, Aleve, Naproxen, Ibuprofen, Motrin, Advil, Goody's, BC's, all herbal medications, fish oil, and all vitamins.   WHAT DO I DO ABOUT MY DIABETES MEDICATION?  Marland Kitchen Do not take oral diabetes medicines (pills) the morning of surgery. - Do NOT take your metformin (Glucophage) the morning of surgery . Marland Kitchen THE NIGHT BEFORE SURGERY, take 10 units of Levemir insulin.     . THE MORNING OF SURGERY, take 10 units of Levemir insulin  . THE DAY OF SURGERY, do NOT take any injectables for your diabetes, including your Trulicity.    HOW TO MANAGE YOUR DIABETES BEFORE AND AFTER SURGERY  Why is it important to control my blood sugar before and after surgery? . Improving blood sugar levels before and after surgery helps healing and can limit problems. . A way of improving blood sugar control is eating a healthy diet by: o  Eating less sugar and carbohydrates o   Increasing activity/exercise o  Talking with your doctor about reaching your blood sugar goals . High blood sugars (greater than 180 mg/dL) can raise your risk of infections and slow your recovery, so you will need to focus on controlling your diabetes during the weeks before surgery. . Make sure that the doctor who takes care of your diabetes knows about your planned surgery including the date and location.  How do I manage my blood sugar before surgery? . Check your blood sugar at least 4 times a day, starting 2 days before surgery, to make sure that the level is not too high or low. . Check your blood sugar the morning of your surgery when you wake up and every 2 hours until you get to the Short Stay unit. o If your blood sugar is less than 70 mg/dL, you will need to treat for low blood sugar: - Do not take insulin. - Treat a low blood sugar (less than 70 mg/dL) with  cup of clear juice (cranberry or apple), 4 glucose tablets, OR glucose gel. - Recheck blood sugar in 15 minutes after treatment (to make sure it is greater than 70 mg/dL). If your blood sugar is not greater than 70 mg/dL on recheck, call [phone] for further instructions. . Report your blood sugar to the short stay nurse when you get to Short Stay.  . If you are admitted to the hospital after surgery: o Your blood sugar will be checked by the staff and you will probably be given insulin after surgery (instead of oral diabetes medicines) to make sure  you have good blood sugar levels. o The goal for blood sugar control after surgery is 80-180 mg/dL.              Do not wear jewelry, make up, or nail polish            Do not wear lotions, powders, perfumes, or deodorant.            Do not shave 48 hours prior to surgery.              Do not bring valuables to the hospital.            [facility] is not responsible for any belongings or valuables.  Do NOT Smoke (Tobacco/Vapping) or drink Alcohol 24 hours prior to your  procedure  If you use a CPAP at night, you may bring all equipment for your overnight stay.   Contacts, glasses, dentures or bridgework may not be worn into surgery.      For patients admitted to the hospital, discharge time will be determined by your treatment team.   Patients discharged the day of surgery will not be allowed to drive home, and someone needs to stay with them for 24 hours.    Special instructions:   [facility]- Preparing For Surgery  Before surgery, you can play an important role. Because skin is not sterile, your skin needs to be as free of germs as possible. You can reduce the number of germs on your skin by washing with CHG (chlorahexidine gluconate) Soap before surgery.  CHG is an antiseptic cleaner which kills germs and bonds with the skin to continue killing germs even after washing.    Oral Hygiene is also important to reduce your risk of infection.  Remember - BRUSH YOUR TEETH THE MORNING OF SURGERY WITH YOUR REGULAR TOOTHPASTE  Please do not use if you have an allergy to CHG or antibacterial soaps. If your skin becomes reddened/irritated stop using the CHG.  Do not shave (including legs and underarms) for at least 48 hours prior to first CHG shower. It is OK to shave your face.  Please follow these instructions carefully.   1. Shower the NIGHT BEFORE SURGERY and the MORNING OF SURGERY with CHG Soap.   2. If you chose to wash your hair, wash your hair first as usual with your normal shampoo.  3. After you shampoo, rinse your hair and body thoroughly to remove the shampoo.  4. Use CHG as you would any other liquid soap. You can apply CHG directly to the skin and wash gently with a scrungie or a clean washcloth.   5. Apply the CHG Soap to your body ONLY FROM THE NECK DOWN.  Do not use on open wounds or open sores. Avoid contact with your eyes, ears, mouth and genitals (private parts). Wash Face and genitals (private parts)  with your normal soap.   6. Wash  thoroughly, paying special attention to the area where your surgery will be performed.  7. Thoroughly rinse your body with warm water from the neck down.  8. DO NOT shower/wash with your normal soap after using and rinsing off the CHG Soap.  9. Pat yourself dry with a CLEAN TOWEL.  10. Wear CLEAN PAJAMAS to bed the night before surgery, wear comfortable clothes the morning of surgery  11. Place CLEAN SHEETS on your bed the night of your first shower and DO NOT SLEEP WITH PETS.   Day of Surgery:   Do not  apply any deodorants/lotions.  Please wear clean clothes to the hospital/surgery center.   Remember to brush your teeth WITH YOUR REGULAR TOOTHPASTE.   Please read over the following fact sheets that you were given.

## 2019-08-05 ENCOUNTER — Other Ambulatory Visit (HOSPITAL_COMMUNITY)
Admission: RE | Admit: 2019-08-05 | Discharge: 2019-08-05 | Disposition: A | Discharge status: Home / Self Care | Payer: Medicare HMO | Source: Ambulatory Visit | Attending: Neurosurgery | Admitting: Neurosurgery

## 2019-08-05 ENCOUNTER — Other Ambulatory Visit: Payer: Self-pay

## 2019-08-05 ENCOUNTER — Encounter (HOSPITAL_COMMUNITY)
Admission: RE | Admit: 2019-08-05 | Discharge: 2019-08-05 | Disposition: A | Discharge status: Home / Self Care | Payer: Medicare HMO | Source: Ambulatory Visit | Attending: Neurosurgery | Admitting: Neurosurgery

## 2019-08-05 ENCOUNTER — Encounter (HOSPITAL_COMMUNITY): Payer: Self-pay

## 2019-08-05 HISTORY — DX: Other specified postprocedural states: R11.2

## 2019-08-05 HISTORY — DX: Nausea with vomiting, unspecified: Z98.890

## 2019-08-05 LAB — CBC
HCT: 45.1 % (ref 36.0–46.0)
Hemoglobin: 14.9 g/dL (ref 12.0–15.0)
MCH: 30.9 pg (ref 26.0–34.0)
MCHC: 33 g/dL (ref 30.0–36.0)
MCV: 93.6 fL (ref 80.0–100.0)
Platelets: 283 10*3/uL (ref 150–400)
RBC: 4.82 MIL/uL (ref 3.87–5.11)
RDW: 13.4 % (ref 11.5–15.5)
WBC: 10.9 10*3/uL — ABNORMAL HIGH (ref 4.0–10.5)
nRBC: 0 % (ref 0.0–0.2)

## 2019-08-05 LAB — BASIC METABOLIC PANEL
Anion gap: 10 (ref 5–15)
BUN: 15 mg/dL (ref 8–23)
CO2: 28 mmol/L (ref 22–32)
Calcium: 9.9 mg/dL (ref 8.9–10.3)
Chloride: 101 mmol/L (ref 98–111)
Creatinine, Ser: 0.83 mg/dL (ref 0.44–1.00)
GFR calc Af Amer: >60 mL/min (ref >60)
GFR calc non Af Amer: >60 mL/min (ref >60)
Glucose, Bld: 216 mg/dL — ABNORMAL HIGH (ref 70–99)
Potassium: 3.5 mmol/L (ref 3.5–5.1)
Sodium: 139 mmol/L (ref 135–145)

## 2019-08-05 LAB — PROTIME-INR
INR: 0.9 (ref 0.8–1.2)
Prothrombin Time: 11.8 seconds (ref 11.4–15.2)

## 2019-08-05 LAB — SARS CORONAVIRUS 2 (TAT 6-24 HRS): SARS Coronavirus 2: NEGATIVE

## 2019-08-05 LAB — HEMOGLOBIN A1C
Hgb A1c MFr Bld: 8.1 % — ABNORMAL HIGH (ref 4.8–5.6)
Mean Plasma Glucose: 185.77 mg/dL

## 2019-08-05 LAB — GLUCOSE, CAPILLARY: Glucose-Capillary: 233 mg/dL — ABNORMAL HIGH (ref 70–99)

## 2019-08-05 LAB — TYPE AND SCREEN
ABO/RH(D): A POS
Antibody Screen: NEGATIVE

## 2019-08-05 LAB — SURGICAL PCR SCREEN
MRSA, PCR: NEGATIVE
Staphylococcus aureus: NEGATIVE

## 2019-08-05 NOTE — Progress Notes (Signed)
PCP - Dr. Heron Nay at [facility] Patient Care Cardiologist - Dr. Rennis Golden  Chest x-ray - Not indicated EKG - 05/29/19 Stress Test - 06/19/19 ECHO - 05/02/18 Cardiac Cath - Denies   Sleep Study - Yes has OSA CPAP - Not currently using, sleeps in recliner  Fasting Blood Sugar - 145 per patient 233 on admission appt Checks Blood Sugar ___3__ times a day averages 200 per patient  Blood Thinner Instructions: Eliquis last dose 07/31/19 Aspirin Instructions:  COVID TEST- 08/05/19  Anesthesia review: Yes recent stress test and chronic fib  Patient denies shortness of breath, fever, cough and chest pain at PAT appointment   All instructions explained to the patient, with a verbal understanding of the material. Patient agrees to go over the instructions while at home for a better understanding. Patient also instructed to self quarantine after being tested for COVID-19. The opportunity to ask questions was provided.

## 2019-08-06 NOTE — Progress Notes (Signed)
Anesthesia Chart Review:  Followed by cardiology for hx of paroxysmal afib, HTN. Seen 07/15/19 for preop clearance. Per note, "Preoperative assessment: patient is somewhat limited in activity by back pain. She underwent a NST 06/2019 to evaluate for ischemic etiology of her chest pain which was without ischemia. - Based on ACC/AHA guidelines, Natasha Rosales would be at acceptable risk for the planned procedure without further cardiovascular testing. - I will route this recommendation to the requesting party via Epic fax function - Per Dr. Rennis Golden, patient can hold eliquis 3 days prior to her upcoming procedure with plans to restart as soon as she is cleared to do so by Dr. Venetia Maxon."  Pt reported LD eliquis 07/31/19.   OSA intolerant to CPAP.   DMII. A1c 8.1 on preop labs, down from 11.8 a year ago.   Preop labs reviewed, elevated BG 216 consistent with poorly controlled DMII, otherwise unremarkable.   EKG 05/29/19: Afib. Vent rate 101. Nonspecific t abnormality.  Nuclear stress 06/19/19:  EKG without ST changes  Normal perfusion No ischemia or scar.  Images not gated due to atrial fibrillation  Low risk study   TTE 05/02/18: 1. The left ventricle has normal systolic function, with an ejection  fraction of 55-60%. The cavity size was normal. There is mildly increased  left ventricular wall thickness. Left ventricular diastology could not be  evaluated secondary to atrial  fibrillation. No evidence of left ventricular regional wall motion  abnormalities.  2. The right ventricle has normal systolic function. The cavity was  normal. There is no increase in right ventricular wall thickness.  3. The mitral valve is normal in structure.  4. The tricuspid valve is normal in structure.  5. The aortic valve is normal in structure. no stenosis of the aortic  valve.  6. The aortic root and ascending aorta are normal in size and structure.  7. No evidence of left ventricular regional wall  motion abnormalities.    Natasha Rosales [facility] Short Stay Center/Anesthesiology Phone [phone] 08/06/2019 10:26 AM

## 2019-08-06 NOTE — H&P (Signed)
Patient ID:   [id number] Patient: Natasha Rosales  Date of Birth: August 26, 1940 Visit Type: Office Visit   Date: 04/13/2019 02:00 PM Provider: Marchia Meiers. Vertell Limber MD   This 79 year old female presents for back pain.  HISTORY OF PRESENT ILLNESS: 1.  back pain  Patient returns reporting increased pain x3 weeks.  She reports left greater than right lumbosacral pain with pain left leg to the ankle and behind the right knee.  She did not receive the new prescription for hydrocodone and has been taken ibuprofen OTC as needed.  X-rays on canopy  Of note, patient's surgery was canceled due to new dx AFib February 2020. She was placed on Eliquis and a stress test was scheduled.  She did not follow-up with the stress test due to her fear of COVID.  Eliquis has been refilled by her primary care provider.  She has not seen a cardiologist.   the patient is currently complaining of pain ranging from 8-10 out of 10 in severity.  She has been diagnosed with atrial fibrillation is also the managing her diabetes.    The patient has 4/5 left EHL strength and was known to have severe spinal stenosis at the L4-5 level. she delayed her surgical care due to concerns about Coronavirus   lumbar radiographs show mobile spondylolisthesis of L4 on L5 of 10 mm on neutral lateral radiograph and 9 mm on flexion and 9.6 mm on extension   her symptoms appear unchanged and certainly have not improved in terms of their severity   I have recommended proceeding with our previously planned L4-5 decompression and fusion surgery but of also recommended a new MRI be obtained to make sure there is no significant change in structural abnormalities before proceeding with surgery given the length of time that is occurred between my initial recommendation and now with almost a year between MRI scans   I also think it prudent for her to have cardiology clearance for surgery.  She is taking Eliquis but has been told that she does not have  atrial fibrillation.  If she does not have atrial fibrillation that I think she come off the medication and certainly this should be cleared up before proceeding with surgery      Medical/Surgical/Interim History Reviewed, no change.  Last detailed document date:02/03/2018.     PAST MEDICAL HISTORY, SURGICAL HISTORY, FAMILY HISTORY, SOCIAL HISTORY AND REVIEW OF SYSTEMS I have reviewed the patient's past medical, surgical, family and social history as well as the comprehensive review of systems as included on the [facility] history form dated 04/13/2019, which I have signed.  Family History: Reviewed, no changes.  Last detailed document date:02/03/2018.   Social History: Reviewed, no changes. Last detailed document date: 06/05/2018.    MEDICATIONS: (added, continued or stopped this visit) Started Medication Directions Instruction Stopped  gabapentin 100 mg capsule take 3 capsule by oral route 3 times every day    hydrochlorothiazide 25 mg tablet take 1 tablet by oral route  every day   04/10/2019 hydrocodone 5 mg-acetaminophen 325 mg tablet take 1 tablet by oral route  every 6 hours as needed for pain    lamotrigine 25 mg tablet take 1 tablet by oral route  every bedtime    Levemir FlexTouch U-100 Insulin 100 unit/mL (3 mL) subcutaneous pen inject 25 units by subcutaneous route  every day at bedtime    Lexapro 20 mg tablet take 1 tablet by oral route  every day  lisinopril 40 mg tablet take 1 tablet by oral route  every day    Protonix 40 mg tablet,delayed release take 1 tablet by oral route  every day      ALLERGIES: Ingredient Reaction Medication Name Comment PROPOXYPHENE NAPSYLATE  Darvocet-N  ACETAMINOPHEN  Darvocet-N  PROPOXYPHENE HCL  Darvon   Reviewed, no changes.    PHYSICAL EXAM:  Vitals Date Temp F BP Pulse Ht In Wt Lb BMI BSA Pain  Score 04/13/2019 97.1 92/69 144 65 194.8 32.42  8/10     IMPRESSION:   persistent severe spinal stenosis L4-5 with spondylolisthesis of L4-L5. new diagnosis of atrial fibrillation.   Patient is on an anti-coagulant, anti-inflammatory or supplement that may increase bleeding time. Patient advised to stop medicine prior to surgery.  Comments:  Patient informed a cardiology clearance would be required and Eliquis would need to be stopped prior to surgery.  PLAN:  proceed with new MRI of the lumbar spine and plan for surgery after cardiology clearance. risks and benefits were discussed in detail with patient and patient education was performed. she was given a prescription for a lumbosacral orthosis  Orders: Office Procedures/Services: Assessment Service Comments I48.91 Patient requests referral to [facility] for preop clearance.  Previous surgery was canceled due to AFib.  Patient did not follow-up with stress test recommended.  Patient on Eliquis.   Diagnostic Procedures: Assessment Procedure M54.16 Lumbar Spine- AP/Lat M54.16 Lumbar Spine- AP/Lat/Flex/Ex M54.16 MRI Spine/lumb W/o Contrast Instruction(s)/Education: Assessment Instruction Z68.32 Dietary management education, guidance, and counseling Miscellaneous: Assessment  M43.16 LSO Brace  Completed Orders (this encounter) Order Details Reason Side Interpretation Result Initial Treatment Date Region Dietary management education, guidance, and counseling Encouraged patient to eat well balanced diet.       Lumbar Spine- AP/Lat/Flex/Ex      04/13/2019 All Levels to All Levels  Assessment/Plan  # Detail Type Description  1. Assessment Low back pain, unspecified back pain laterality, with sciatica presence unspecified (M54.5).     2. Assessment Lumbar radiculopathy (M54.16).     3. Assessment Spondylolisthesis, lumbar region (M43.16).  Plan Orders LSO  Brace.     4. Assessment Spinal stenosis of lumbosacral region (M48.07).     5. Assessment Atrial fibrillation, unspecified type (I48.91).  Plan Orders Cardiology Referral. Clinical information/comments: possible A.Fib and cardiac clearance for lumbar surgery. Patient requests referral to [facility] for preop clearance.  Previous surgery was canceled due to AFib.  Patient did not follow-up with stress test recommended.  Patient on Eliquis.     6. Assessment Body mass index (BMI) 32.0-32.9, adult (A35.57).  Plan Orders Today's instructions / counseling include(s) Dietary management education, guidance, and counseling. Clinical information/comments: Encouraged patient to eat well balanced diet.       Pain Management Plan Pain Scale: 8/10. Method: Numeric Pain Intensity Scale. Location: back. Onset: 02/04/2016. Duration: varies. Quality: discomforting. Pain management follow-up plan of care: Patient will continue medication management..              Provider:  Danae Orleans. Venetia Maxon MD  04/19/2019 12:27 PM    Dictation edited by: Danae Orleans. Venetia Maxon    CC Providers: [name] [facility] [address] [place],  [place]  [address]-   [name]  [facility] [address] [place], [place] [address]-               Electronically signed by Danae Orleans. Venetia Maxon MD on 04/19/2019 12:27 PM

## 2019-08-06 NOTE — Anesthesia Preprocedure Evaluation (Addendum)
Anesthesia Evaluation  Patient identified by MRN, date of birth, ID band Patient awake    Reviewed: Allergy & Precautions, NPO status , Patient's Chart, lab work & pertinent test results  History of Anesthesia Complications (+) PONV and history of anesthetic complications  Airway Mallampati: II  TM Distance: >3 FB Neck ROM: Full    Dental  (+) Dental Advisory Given   Pulmonary sleep apnea ,    Pulmonary exam normal        Cardiovascular hypertension, Pt. on medications Normal cardiovascular exam+ dysrhythmias Atrial Fibrillation   Study Highlights   EKG without ST changes  Normal perfusion No ischemia or scar.  Images not gated due to atrial fibrillation  Low risk study     Neuro/Psych PSYCHIATRIC DISORDERS Anxiety Depression negative neurological ROS     GI/Hepatic Neg liver ROS, GERD  ,  Endo/Other  diabetes  Renal/GU negative Renal ROS     Musculoskeletal negative musculoskeletal ROS (+)   Abdominal   Peds  Hematology negative hematology ROS (+)   Anesthesia Other Findings   Reproductive/Obstetrics                           Anesthesia Physical Anesthesia Plan  ASA: III  Anesthesia Plan: General   Post-op Pain Management:    Induction: Intravenous  PONV Risk Score and Plan: 4 or greater and Ondansetron, Propofol infusion, Midazolam and Treatment may vary due to age or medical condition  Airway Management Planned: Oral ETT  Additional Equipment:   Intra-op Plan:   Post-operative Plan: Extubation in OR  Informed Consent: I have reviewed the patients History and Physical, chart, labs and discussed the procedure including the risks, benefits and alternatives for the proposed anesthesia with the patient or authorized representative who has indicated his/her understanding and acceptance.     Dental advisory given  Plan Discussed with: Anesthesiologist  Anesthesia  Plan Comments: (PAT note by Antionette Poles, PA-C: Followed by cardiology for hx of paroxysmal afib, HTN. Seen 07/15/19 for preop clearance. Per note, "Preoperative assessment: patient is somewhat limited in activity by back pain. She underwent a NST 06/2019 to evaluate for ischemic etiology of her chest pain which was without ischemia. - Based on ACC/AHA guidelines, KAMIYA ACORD would be at acceptable risk for the planned procedure without further cardiovascular testing. - I will route this recommendation to the requesting party via Epic fax function - Per Dr. Rennis Golden, patient can hold eliquis 3 days prior to her upcoming procedure with plans to restart as soon as she is cleared to do so by Dr. Venetia Maxon."  Pt reported LD eliquis 07/31/19.   OSA intolerant to CPAP.   DMII. A1c 8.1 on preop labs, down from 11.8 a year ago.   Preop labs reviewed, elevated BG 216 consistent with poorly controlled DMII, otherwise unremarkable.   EKG 05/29/19: Afib. Vent rate 101. Nonspecific t abnormality.  Nuclear stress 06/19/19: EKG without ST changes Normal perfusion No ischemia or scar. Images not gated due to atrial fibrillation Low risk study   TTE 05/02/18: 1. The left ventricle has normal systolic function, with an ejection  fraction of 55-60%. The cavity size was normal. There is mildly increased  left ventricular wall thickness. Left ventricular diastology could not be  evaluated secondary to atrial  fibrillation. No evidence of left ventricular regional wall motion  abnormalities.  2. The right ventricle has normal systolic function. The cavity was  normal. There is no increase  in right ventricular wall thickness.  3. The mitral valve is normal in structure.  4. The tricuspid valve is normal in structure.  5. The aortic valve is normal in structure. no stenosis of the aortic  valve.  6. The aortic root and ascending aorta are normal in size and structure.  7. No evidence of left ventricular  regional wall motion abnormalities.  )       Anesthesia Quick Evaluation

## 2019-08-07 ENCOUNTER — Inpatient Hospital Stay (HOSPITAL_COMMUNITY): Payer: Medicare HMO | Admitting: Certified Registered Nurse Anesthetist

## 2019-08-07 ENCOUNTER — Other Ambulatory Visit: Payer: Self-pay

## 2019-08-07 ENCOUNTER — Encounter (HOSPITAL_COMMUNITY)
Admission: RE | Disposition: A | Discharge status: Home / Self Care | Payer: Self-pay | Source: Home / Self Care | Attending: Neurosurgery

## 2019-08-07 ENCOUNTER — Inpatient Hospital Stay (HOSPITAL_COMMUNITY)
Admission: RE | Admit: 2019-08-07 | Discharge: 2019-08-08 | DRG: 455 | Disposition: A | Discharge status: Home / Self Care | Payer: Medicare HMO | Attending: Neurosurgery | Admitting: Neurosurgery

## 2019-08-07 ENCOUNTER — Encounter (HOSPITAL_COMMUNITY): Payer: Self-pay | Admitting: Neurosurgery

## 2019-08-07 ENCOUNTER — Inpatient Hospital Stay (HOSPITAL_COMMUNITY): Payer: Medicare HMO

## 2019-08-07 ENCOUNTER — Inpatient Hospital Stay (HOSPITAL_COMMUNITY): Payer: Medicare HMO | Admitting: Physician Assistant

## 2019-08-07 DIAGNOSIS — Z7901 Long term (current) use of anticoagulants: Secondary | ICD-10-CM | POA: Diagnosis not present

## 2019-08-07 DIAGNOSIS — G4730 Sleep apnea, unspecified: Secondary | ICD-10-CM | POA: Diagnosis present

## 2019-08-07 DIAGNOSIS — I10 Essential (primary) hypertension: Secondary | ICD-10-CM | POA: Diagnosis present

## 2019-08-07 DIAGNOSIS — Z20822 Contact with and (suspected) exposure to covid-19: Secondary | ICD-10-CM | POA: Diagnosis present

## 2019-08-07 DIAGNOSIS — M5416 Radiculopathy, lumbar region: Secondary | ICD-10-CM | POA: Diagnosis present

## 2019-08-07 DIAGNOSIS — Z419 Encounter for procedure for purposes other than remedying health state, unspecified: Secondary | ICD-10-CM

## 2019-08-07 DIAGNOSIS — M4807 Spinal stenosis, lumbosacral region: Secondary | ICD-10-CM | POA: Diagnosis present

## 2019-08-07 DIAGNOSIS — M4316 Spondylolisthesis, lumbar region: Secondary | ICD-10-CM | POA: Diagnosis present

## 2019-08-07 DIAGNOSIS — I480 Paroxysmal atrial fibrillation: Secondary | ICD-10-CM | POA: Diagnosis present

## 2019-08-07 DIAGNOSIS — K219 Gastro-esophageal reflux disease without esophagitis: Secondary | ICD-10-CM | POA: Diagnosis present

## 2019-08-07 DIAGNOSIS — E119 Type 2 diabetes mellitus without complications: Secondary | ICD-10-CM | POA: Diagnosis present

## 2019-08-07 LAB — GLUCOSE, CAPILLARY
Glucose-Capillary: 275 mg/dL — ABNORMAL HIGH (ref 70–99)
Glucose-Capillary: 187 mg/dL — ABNORMAL HIGH (ref 70–99)
Glucose-Capillary: 263 mg/dL — ABNORMAL HIGH (ref 70–99)
Glucose-Capillary: 268 mg/dL — ABNORMAL HIGH (ref 70–99)
Glucose-Capillary: 320 mg/dL — ABNORMAL HIGH (ref 70–99)

## 2019-08-07 SURGERY — POSTERIOR LUMBAR FUSION 1 LEVEL
Anesthesia: General | Site: Back

## 2019-08-07 MED ORDER — ZOLPIDEM TARTRATE 5 MG PO TABS: 5.0000 mg | ORAL_TABLET | Freq: Every evening | ORAL | Status: DC | PRN

## 2019-08-07 MED ORDER — ROSUVASTATIN CALCIUM 20 MG PO TABS: 40.0000 mg | ORAL_TABLET | Freq: Every day | ORAL | Status: DC

## 2019-08-07 MED ORDER — LACTATED RINGERS IV SOLN: INTRAVENOUS | Status: DC

## 2019-08-07 MED ORDER — PROPOFOL 10 MG/ML IV BOLUS
INTRAVENOUS | Status: AC
  Filled 2019-08-07: qty 20

## 2019-08-07 MED ORDER — INSULIN ASPART 100 UNIT/ML ~~LOC~~ SOLN
4.0000 [IU] | Freq: Three times a day (TID) | SUBCUTANEOUS | Status: DC
  Administered 2019-08-08: 4 [IU] via SUBCUTANEOUS

## 2019-08-07 MED ORDER — ACETAMINOPHEN 325 MG PO TABS
650.0000 mg | ORAL_TABLET | ORAL | Status: DC | PRN
  Administered 2019-08-07: 650 mg via ORAL
  Filled 2019-08-07: qty 2

## 2019-08-07 MED ORDER — PHENOL 1.4 % MT LIQD: 1.0000 | OROMUCOSAL | Status: DC | PRN

## 2019-08-07 MED ORDER — ONDANSETRON HCL 4 MG/2ML IJ SOLN
INTRAMUSCULAR | Status: DC | PRN
  Administered 2019-08-07: 4 mg via INTRAVENOUS

## 2019-08-07 MED ORDER — LIDOCAINE-EPINEPHRINE 1 %-1:100000 IJ SOLN
INTRAMUSCULAR | Status: DC | PRN
  Administered 2019-08-07: 8.5 mL

## 2019-08-07 MED ORDER — ROCURONIUM BROMIDE 10 MG/ML (PF) SYRINGE
PREFILLED_SYRINGE | INTRAVENOUS | Status: AC
  Filled 2019-08-07: qty 10

## 2019-08-07 MED ORDER — ACETAMINOPHEN 650 MG RE SUPP: 650.0000 mg | RECTAL | Status: DC | PRN

## 2019-08-07 MED ORDER — CHLORHEXIDINE GLUCONATE 0.12 % MT SOLN: 15.0000 mL | Freq: Once | OROMUCOSAL | Status: AC

## 2019-08-07 MED ORDER — CHLORHEXIDINE GLUCONATE CLOTH 2 % EX PADS: 6.0000 | MEDICATED_PAD | Freq: Once | CUTANEOUS | Status: DC

## 2019-08-07 MED ORDER — LAMOTRIGINE 25 MG PO TABS
25.0000 mg | ORAL_TABLET | Freq: Two times a day (BID) | ORAL | Status: DC
  Administered 2019-08-07: 25 mg via ORAL
  Filled 2019-08-07 (×2): qty 1

## 2019-08-07 MED ORDER — KCL IN DEXTROSE-NACL 20-5-0.45 MEQ/L-%-% IV SOLN: INTRAVENOUS | Status: DC

## 2019-08-07 MED ORDER — SODIUM CHLORIDE 0.9 % IV SOLN
250.0000 mL | INTRAVENOUS | Status: DC
  Administered 2019-08-07: 250 mL via INTRAVENOUS

## 2019-08-07 MED ORDER — LIDOCAINE 2% (20 MG/ML) 5 ML SYRINGE
INTRAMUSCULAR | Status: AC
  Filled 2019-08-07: qty 5

## 2019-08-07 MED ORDER — HYDROCODONE-ACETAMINOPHEN 5-325 MG PO TABS
1.0000 | ORAL_TABLET | ORAL | Status: DC | PRN
  Administered 2019-08-08: 2 via ORAL
  Filled 2019-08-07: qty 2

## 2019-08-07 MED ORDER — INSULIN DETEMIR 100 UNIT/ML ~~LOC~~ SOLN
20.0000 [IU] | Freq: Two times a day (BID) | SUBCUTANEOUS | Status: DC
  Administered 2019-08-07: 20 [IU] via SUBCUTANEOUS
  Filled 2019-08-07 (×3): qty 0.2

## 2019-08-07 MED ORDER — FENTANYL CITRATE (PF) 100 MCG/2ML IJ SOLN
INTRAMUSCULAR | Status: AC
  Filled 2019-08-07: qty 2

## 2019-08-07 MED ORDER — DULAGLUTIDE 1.5 MG/0.5ML ~~LOC~~ SOAJ: 1.5000 mg | SUBCUTANEOUS | Status: DC

## 2019-08-07 MED ORDER — PROPOFOL 10 MG/ML IV BOLUS
INTRAVENOUS | Status: DC | PRN
  Administered 2019-08-07: 120 mg via INTRAVENOUS

## 2019-08-07 MED ORDER — CITALOPRAM HYDROBROMIDE 40 MG PO TABS
40.0000 mg | ORAL_TABLET | Freq: Every day | ORAL | Status: DC
  Filled 2019-08-07: qty 1

## 2019-08-07 MED ORDER — LOSARTAN POTASSIUM 50 MG PO TABS
50.0000 mg | ORAL_TABLET | Freq: Every day | ORAL | Status: DC
  Administered 2019-08-07: 50 mg via ORAL
  Filled 2019-08-07: qty 1

## 2019-08-07 MED ORDER — INSULIN ASPART 100 UNIT/ML ~~LOC~~ SOLN
0.0000 [IU] | Freq: Three times a day (TID) | SUBCUTANEOUS | Status: DC
  Administered 2019-08-07: 8 [IU] via SUBCUTANEOUS
  Administered 2019-08-08: 5 [IU] via SUBCUTANEOUS

## 2019-08-07 MED ORDER — PROMETHAZINE HCL 25 MG/ML IJ SOLN: 6.2500 mg | INTRAMUSCULAR | Status: DC | PRN

## 2019-08-07 MED ORDER — ORAL CARE MOUTH RINSE: 15.0000 mL | Freq: Once | OROMUCOSAL | Status: AC

## 2019-08-07 MED ORDER — LIDOCAINE-EPINEPHRINE 1 %-1:100000 IJ SOLN
INTRAMUSCULAR | Status: AC
  Filled 2019-08-07: qty 1

## 2019-08-07 MED ORDER — BUPIVACAINE LIPOSOME 1.3 % IJ SUSP
20.0000 mL | Freq: Once | INTRAMUSCULAR | Status: AC
  Administered 2019-08-07: 20 mL
  Filled 2019-08-07 (×3): qty 20

## 2019-08-07 MED ORDER — ACETAMINOPHEN 500 MG PO TABS
1000.0000 mg | ORAL_TABLET | Freq: Once | ORAL | Status: AC
  Administered 2019-08-07: 1000 mg via ORAL
  Filled 2019-08-07: qty 2

## 2019-08-07 MED ORDER — MENTHOL 3 MG MT LOZG: 1.0000 | LOZENGE | OROMUCOSAL | Status: DC | PRN

## 2019-08-07 MED ORDER — SODIUM CHLORIDE 0.9% FLUSH: 3.0000 mL | INTRAVENOUS | Status: DC | PRN

## 2019-08-07 MED ORDER — ALUM & MAG HYDROXIDE-SIMETH 200-200-20 MG/5ML PO SUSP: 30.0000 mL | Freq: Four times a day (QID) | ORAL | Status: DC | PRN

## 2019-08-07 MED ORDER — BISACODYL 10 MG RE SUPP: 10.0000 mg | Freq: Every day | RECTAL | Status: DC | PRN

## 2019-08-07 MED ORDER — SUGAMMADEX SODIUM 200 MG/2ML IV SOLN
INTRAVENOUS | Status: DC | PRN
  Administered 2019-08-07: 180 mg via INTRAVENOUS

## 2019-08-07 MED ORDER — ROCURONIUM BROMIDE 10 MG/ML (PF) SYRINGE
PREFILLED_SYRINGE | INTRAVENOUS | Status: DC | PRN
  Administered 2019-08-07: 50 mg via INTRAVENOUS
  Administered 2019-08-07: 20 mg via INTRAVENOUS

## 2019-08-07 MED ORDER — LIDOCAINE 20MG/ML (2%) 15 ML SYRINGE OPTIME
INTRAMUSCULAR | Status: DC | PRN
  Administered 2019-08-07: 60 mg via INTRAVENOUS

## 2019-08-07 MED ORDER — 0.9 % SODIUM CHLORIDE (POUR BTL) OPTIME
TOPICAL | Status: DC | PRN
  Administered 2019-08-07: 1000 mL

## 2019-08-07 MED ORDER — DOCUSATE SODIUM 100 MG PO CAPS
100.0000 mg | ORAL_CAPSULE | Freq: Two times a day (BID) | ORAL | Status: DC
  Administered 2019-08-07: 100 mg via ORAL
  Filled 2019-08-07: qty 1

## 2019-08-07 MED ORDER — METHOCARBAMOL 500 MG PO TABS
ORAL_TABLET | ORAL | Status: AC
  Filled 2019-08-07: qty 1

## 2019-08-07 MED ORDER — PHENYLEPHRINE HCL-NACL 10-0.9 MG/250ML-% IV SOLN
INTRAVENOUS | Status: DC | PRN
  Administered 2019-08-07: 25 ug/min via INTRAVENOUS

## 2019-08-07 MED ORDER — PANTOPRAZOLE SODIUM 40 MG PO TBEC
40.0000 mg | DELAYED_RELEASE_TABLET | Freq: Every day | ORAL | Status: DC
  Administered 2019-08-07: 40 mg via ORAL
  Filled 2019-08-07: qty 1

## 2019-08-07 MED ORDER — CELECOXIB 200 MG PO CAPS
200.0000 mg | ORAL_CAPSULE | Freq: Once | ORAL | Status: AC
  Administered 2019-08-07: 200 mg via ORAL
  Filled 2019-08-07: qty 1

## 2019-08-07 MED ORDER — SODIUM CHLORIDE 0.9% FLUSH: 3.0000 mL | Freq: Two times a day (BID) | INTRAVENOUS | Status: DC

## 2019-08-07 MED ORDER — FENTANYL CITRATE (PF) 250 MCG/5ML IJ SOLN
INTRAMUSCULAR | Status: AC
  Filled 2019-08-07: qty 5

## 2019-08-07 MED ORDER — ALBUMIN HUMAN 5 % IV SOLN: INTRAVENOUS | Status: DC | PRN

## 2019-08-07 MED ORDER — CEFAZOLIN SODIUM-DEXTROSE 2-4 GM/100ML-% IV SOLN
2.0000 g | Freq: Three times a day (TID) | INTRAVENOUS | Status: AC
  Administered 2019-08-07 – 2019-08-08 (×2): 2 g via INTRAVENOUS
  Filled 2019-08-07 (×2): qty 100

## 2019-08-07 MED ORDER — THROMBIN 5000 UNITS EX SOLR
CUTANEOUS | Status: AC
  Filled 2019-08-07: qty 5000

## 2019-08-07 MED ORDER — PANTOPRAZOLE SODIUM 40 MG IV SOLR: 40.0000 mg | Freq: Every day | INTRAVENOUS | Status: DC

## 2019-08-07 MED ORDER — DEXAMETHASONE SODIUM PHOSPHATE 10 MG/ML IJ SOLN
INTRAMUSCULAR | Status: AC
  Filled 2019-08-07: qty 1

## 2019-08-07 MED ORDER — PHENYLEPHRINE 40 MCG/ML (10ML) SYRINGE FOR IV PUSH (FOR BLOOD PRESSURE SUPPORT)
PREFILLED_SYRINGE | INTRAVENOUS | Status: DC | PRN
  Administered 2019-08-07 (×3): 80 ug via INTRAVENOUS

## 2019-08-07 MED ORDER — METHOCARBAMOL 1000 MG/10ML IJ SOLN
500.0000 mg | Freq: Four times a day (QID) | INTRAVENOUS | Status: DC | PRN
  Filled 2019-08-07: qty 5

## 2019-08-07 MED ORDER — DEXAMETHASONE SODIUM PHOSPHATE 10 MG/ML IJ SOLN
INTRAMUSCULAR | Status: DC | PRN
  Administered 2019-08-07: 4 mg via INTRAVENOUS

## 2019-08-07 MED ORDER — CHLORHEXIDINE GLUCONATE 0.12 % MT SOLN
OROMUCOSAL | Status: AC
  Administered 2019-08-07: 15 mL via OROMUCOSAL
  Filled 2019-08-07: qty 15

## 2019-08-07 MED ORDER — OXYCODONE HCL 5 MG PO TABS
5.0000 mg | ORAL_TABLET | ORAL | Status: DC | PRN
  Administered 2019-08-07: 10 mg via ORAL
  Filled 2019-08-07: qty 2

## 2019-08-07 MED ORDER — POLYETHYLENE GLYCOL 3350 17 G PO PACK: 17.0000 g | PACK | Freq: Every day | ORAL | Status: DC | PRN

## 2019-08-07 MED ORDER — HYDROCODONE-ACETAMINOPHEN 5-325 MG PO TABS: 1.0000 | ORAL_TABLET | Freq: Four times a day (QID) | ORAL | Status: DC | PRN

## 2019-08-07 MED ORDER — OXYCODONE HCL 5 MG PO TABS
5.0000 mg | ORAL_TABLET | ORAL | Status: DC | PRN
  Administered 2019-08-07: 5 mg via ORAL

## 2019-08-07 MED ORDER — METHOCARBAMOL 500 MG PO TABS
500.0000 mg | ORAL_TABLET | Freq: Four times a day (QID) | ORAL | Status: DC | PRN
  Administered 2019-08-07 – 2019-08-08 (×3): 500 mg via ORAL
  Filled 2019-08-07 (×4): qty 1

## 2019-08-07 MED ORDER — HYDROCHLOROTHIAZIDE 25 MG PO TABS: 25.0000 mg | ORAL_TABLET | Freq: Every morning | ORAL | Status: DC

## 2019-08-07 MED ORDER — ONDANSETRON HCL 4 MG/2ML IJ SOLN: 4.0000 mg | Freq: Four times a day (QID) | INTRAMUSCULAR | Status: DC | PRN

## 2019-08-07 MED ORDER — FENTANYL CITRATE (PF) 100 MCG/2ML IJ SOLN
INTRAMUSCULAR | Status: DC | PRN
  Administered 2019-08-07 (×5): 50 ug via INTRAVENOUS

## 2019-08-07 MED ORDER — ONDANSETRON HCL 4 MG PO TABS: 4.0000 mg | ORAL_TABLET | Freq: Four times a day (QID) | ORAL | Status: DC | PRN

## 2019-08-07 MED ORDER — BUPIVACAINE HCL (PF) 0.5 % IJ SOLN
INTRAMUSCULAR | Status: AC
  Filled 2019-08-07: qty 30

## 2019-08-07 MED ORDER — METFORMIN HCL 500 MG PO TABS
1000.0000 mg | ORAL_TABLET | Freq: Two times a day (BID) | ORAL | Status: DC
  Administered 2019-08-07 – 2019-08-08 (×2): 1000 mg via ORAL
  Filled 2019-08-07 (×2): qty 2

## 2019-08-07 MED ORDER — OXYCODONE HCL 5 MG PO TABS
ORAL_TABLET | ORAL | Status: AC
  Filled 2019-08-07: qty 1

## 2019-08-07 MED ORDER — BUPIVACAINE HCL (PF) 0.5 % IJ SOLN
INTRAMUSCULAR | Status: DC | PRN
  Administered 2019-08-07: 8.5 mL

## 2019-08-07 MED ORDER — MAGNESIUM CITRATE PO SOLN: 1.0000 | Freq: Once | ORAL | Status: DC | PRN

## 2019-08-07 MED ORDER — FENTANYL CITRATE (PF) 100 MCG/2ML IJ SOLN
25.0000 ug | INTRAMUSCULAR | Status: DC | PRN
  Administered 2019-08-07 (×3): 50 ug via INTRAVENOUS

## 2019-08-07 MED ORDER — PANTOPRAZOLE SODIUM 40 MG PO TBEC: 40.0000 mg | DELAYED_RELEASE_TABLET | Freq: Every day | ORAL | Status: DC

## 2019-08-07 MED ORDER — ONDANSETRON HCL 4 MG/2ML IJ SOLN
INTRAMUSCULAR | Status: AC
  Filled 2019-08-07: qty 2

## 2019-08-07 MED ORDER — BIOTIN 5000 MCG PO TABS: 5000.0000 ug | ORAL_TABLET | Freq: Every day | ORAL | Status: DC

## 2019-08-07 MED ORDER — INSULIN ASPART 100 UNIT/ML ~~LOC~~ SOLN
0.0000 [IU] | Freq: Every day | SUBCUTANEOUS | Status: DC
  Administered 2019-08-07: 4 [IU] via SUBCUTANEOUS

## 2019-08-07 MED ORDER — INSULIN ASPART 100 UNIT/ML ~~LOC~~ SOLN
SUBCUTANEOUS | Status: AC
  Filled 2019-08-07: qty 1

## 2019-08-07 MED ORDER — AMLODIPINE BESYLATE 5 MG PO TABS: 5.0000 mg | ORAL_TABLET | Freq: Every day | ORAL | Status: DC

## 2019-08-07 MED ORDER — HYDROMORPHONE HCL 1 MG/ML IJ SOLN: 0.5000 mg | INTRAMUSCULAR | Status: DC | PRN

## 2019-08-07 MED ORDER — CEFAZOLIN SODIUM-DEXTROSE 2-4 GM/100ML-% IV SOLN
2.0000 g | INTRAVENOUS | Status: AC
  Administered 2019-08-07: 2 g via INTRAVENOUS
  Filled 2019-08-07: qty 100

## 2019-08-07 MED ORDER — THROMBIN 5000 UNITS EX SOLR
OROMUCOSAL | Status: DC | PRN
  Administered 2019-08-07: 5 mL via TOPICAL

## 2019-08-07 SURGICAL SUPPLY — 71 items
BASKET BONE COLLECTION (BASKET) ×2 IMPLANT
BLADE CLIPPER SURG (BLADE) IMPLANT
BONE CANC CHIPS 20CC PCAN1/4 (Bone Implant) ×2 IMPLANT
BUR MATCHSTICK NEURO 3.0 LAGG (BURR) ×2 IMPLANT
BUR PRECISION FLUTE 5.0 (BURR) ×2 IMPLANT
CAGE PLIF 8X9X23-12 LUMBAR (Cage) ×4 IMPLANT
CANISTER SUCT 3000ML PPV (MISCELLANEOUS) ×2 IMPLANT
CARTRIDGE OIL MAESTRO DRILL (MISCELLANEOUS) ×1 IMPLANT
CHIPS CANC BONE 20CC PCAN1/4 (Bone Implant) ×1 IMPLANT
CNTNR URN SCR LID CUP LEK RST (MISCELLANEOUS) ×1 IMPLANT
CONT SPEC 4OZ STRL OR WHT (MISCELLANEOUS) ×1
COVER BACK TABLE 60X90IN (DRAPES) ×2 IMPLANT
COVER WAND RF STERILE (DRAPES) IMPLANT
DECANTER SPIKE VIAL GLASS SM (MISCELLANEOUS) ×2 IMPLANT
DERMABOND ADVANCED (GAUZE/BANDAGES/DRESSINGS) ×1
DERMABOND ADVANCED .7 DNX12 (GAUZE/BANDAGES/DRESSINGS) ×1 IMPLANT
DIFFUSER DRILL AIR PNEUMATIC (MISCELLANEOUS) ×2 IMPLANT
DRAPE C-ARM 42X72 X-RAY (DRAPES) ×2 IMPLANT
DRAPE C-ARMOR (DRAPES) ×2 IMPLANT
DRAPE LAPAROTOMY 100X72X124 (DRAPES) ×2 IMPLANT
DRAPE SURG 17X23 STRL (DRAPES) ×2 IMPLANT
DRSG OPSITE POSTOP 4X6 (GAUZE/BANDAGES/DRESSINGS) ×2 IMPLANT
DURAPREP 26ML APPLICATOR (WOUND CARE) ×2 IMPLANT
ELECT REM PT RETURN 9FT ADLT (ELECTROSURGICAL) ×2
ELECTRODE REM PT RTRN 9FT ADLT (ELECTROSURGICAL) ×1 IMPLANT
EVACUATOR 1/8 PVC DRAIN (DRAIN) IMPLANT
GAUZE 4X4 16PLY RFD (DISPOSABLE) IMPLANT
GAUZE SPONGE 4X4 12PLY STRL (GAUZE/BANDAGES/DRESSINGS) ×2 IMPLANT
GLOVE BIO SURGEON STRL SZ8 (GLOVE) ×4 IMPLANT
GLOVE BIOGEL PI IND STRL 8 (GLOVE) ×2 IMPLANT
GLOVE BIOGEL PI IND STRL 8.5 (GLOVE) ×2 IMPLANT
GLOVE BIOGEL PI INDICATOR 8 (GLOVE) ×2
GLOVE BIOGEL PI INDICATOR 8.5 (GLOVE) ×2
GLOVE ECLIPSE 8.0 STRL XLNG CF (GLOVE) ×4 IMPLANT
GLOVE EXAM NITRILE XL STR (GLOVE) IMPLANT
GOWN STRL REUS W/ TWL LRG LVL3 (GOWN DISPOSABLE) IMPLANT
GOWN STRL REUS W/ TWL XL LVL3 (GOWN DISPOSABLE) ×2 IMPLANT
GOWN STRL REUS W/TWL 2XL LVL3 (GOWN DISPOSABLE) ×4 IMPLANT
GOWN STRL REUS W/TWL LRG LVL3 (GOWN DISPOSABLE)
GOWN STRL REUS W/TWL XL LVL3 (GOWN DISPOSABLE) ×2
HEMOSTAT POWDER KIT SURGIFOAM (HEMOSTASIS) ×2 IMPLANT
KIT BASIN OR (CUSTOM PROCEDURE TRAY) ×2 IMPLANT
KIT INFUSE XX SMALL 0.7CC (Orthopedic Implant) ×2 IMPLANT
KIT POSITION SURG JACKSON T1 (MISCELLANEOUS) ×2 IMPLANT
KIT TURNOVER KIT B (KITS) ×2 IMPLANT
MILL MEDIUM DISP (BLADE) ×2 IMPLANT
NEEDLE HYPO 25X1 1.5 SAFETY (NEEDLE) ×2 IMPLANT
NEEDLE SPNL 18GX3.5 QUINCKE PK (NEEDLE) IMPLANT
NS IRRIG 1000ML POUR BTL (IV SOLUTION) ×2 IMPLANT
OIL CARTRIDGE MAESTRO DRILL (MISCELLANEOUS) ×2
PACK LAMINECTOMY NEURO (CUSTOM PROCEDURE TRAY) ×2 IMPLANT
PAD ARMBOARD 7.5X6 YLW CONV (MISCELLANEOUS) ×6 IMPLANT
PATTIES SURGICAL .5 X.5 (GAUZE/BANDAGES/DRESSINGS) IMPLANT
PATTIES SURGICAL .5 X1 (DISPOSABLE) IMPLANT
PATTIES SURGICAL 1X1 (DISPOSABLE) IMPLANT
ROD RELINE-O LORD 5.5X40 (Rod) ×4 IMPLANT
SCREW LOCK RELINE 5.5 TULIP (Screw) ×8 IMPLANT
SCREW RELINE-O POLY 6.5X45 (Screw) ×8 IMPLANT
SPONGE LAP 4X18 RFD (DISPOSABLE) IMPLANT
SPONGE SURGIFOAM ABS GEL 100 (HEMOSTASIS) IMPLANT
STAPLER SKIN PROX WIDE 3.9 (STAPLE) IMPLANT
SUT VIC AB 1 CT1 18XBRD ANBCTR (SUTURE) ×2 IMPLANT
SUT VIC AB 1 CT1 8-18 (SUTURE) ×2
SUT VIC AB 2-0 CT1 18 (SUTURE) ×4 IMPLANT
SUT VIC AB 3-0 SH 8-18 (SUTURE) ×4 IMPLANT
SYR 5ML LL (SYRINGE) IMPLANT
TOWEL GREEN STERILE (TOWEL DISPOSABLE) ×2 IMPLANT
TOWEL GREEN STERILE FF (TOWEL DISPOSABLE) ×2 IMPLANT
TRAY FOLEY MTR SLVR 16FR STAT (SET/KITS/TRAYS/PACK) ×2 IMPLANT
TRAY FOLEY W/BAG SLVR 14FR (SET/KITS/TRAYS/PACK) ×2 IMPLANT
WATER STERILE IRR 1000ML POUR (IV SOLUTION) ×2 IMPLANT

## 2019-08-07 NOTE — Interval H&P Note (Signed)
History and Physical Interval Note:  08/07/2019 11:34 AM  Natasha Rosales  has presented today for surgery, with the diagnosis of Spinal stenosis of lumbosacral region.  The various methods of treatment have been discussed with the patient and family. After consideration of risks, benefits and other options for treatment, the patient has consented to  Procedure(s) with comments: Lumbar 4-5 Posterior lumbar interbody fusion (N/A) - 3C as a surgical intervention.  The patient's history has been reviewed, patient examined, no change in status, stable for surgery.  I have reviewed the patient's chart and labs.  Questions were answered to the patient's satisfaction.     Dorian Heckle

## 2019-08-07 NOTE — Op Note (Signed)
08/07/2019  3:05 PM  PATIENT:  Natasha Rosales  78 y.o. female  PRE-OPERATIVE DIAGNOSIS:  Spinal stenosis of lumbosacral region, spondylolisthesis, lumbar radiculopathy, lumbago L 45 level  POST-OPERATIVE DIAGNOSIS:  Spinal stenosis of lumbosacral region, spondylolisthesis, lumbar radiculopathy, lumbago L 45 level   PROCEDURE:  Procedure(s) with comments: Lumbar Four-Five Posterior lumbar interbody fusion (N/A) - Lumbar Four-Five Posterior lumbar interbody fusion with PEEK cages, autograft, pedicle screw fixation, posterolateral arthrodesis with autograft and allograft  SURGEON:  Surgeon(s) and Role:    * Rashidah Belleville, MD - Primary  PHYSICIAN ASSISTANT: Ostergard, MD  ASSISTANTS: McDaniel, NP  ANESTHESIA:   general  EBL:  300 mL   BLOOD ADMINISTERED:none  DRAINS: none   LOCAL MEDICATIONS USED:  MARCAINE    and LIDOCAINE   SPECIMEN:  No Specimen  DISPOSITION OF SPECIMEN:  N/A  COUNTS:  YES  TOURNIQUET:  * No tourniquets in log *  DICTATION: Patient is 78-year-old woman with mobile spondylolisthesis of L4 on L5 with lumbar stenosis. She has bilateral  L5 radiculopathies. It was elected to take her to surgery for decompression and fusion at this level.   Procedure: Patient was placed in a prone position on the [place] table after smooth and uncomplicated induction of general endotracheal anesthesia. Her low back was prepped and draped in usual sterile fashion with betadine and DuraPrep. Area of incision was infiltrated with local lidocaine. Incision was made to the lumbodorsal fascia was incised and exposure was performed of the L4 through L5 spinous processes laminae facet joint and transverse processes. Intraoperative x-ray was obtained which confirmed correct orientation. A total laminectomy of L4 was performed with disarticulation of the facet joints at this level and thorough decompression was performed of both L4 and L5 nerve roots along with the common dural tube. This  decompression was more involved than would be typical of that performed for PLIF alone and included painstaking dissection of adherent ligament compressing the thecal sac and wide decompression of all neural elements. A thorough discectomy was initially performed on the left with preparation of the endplates for grafting a trial spacer was placed this level and a thorough discectomy was performed on the right as well. Bone autograft was packed within the interspace bilaterally along with extra extra small BMP kit. Bilateral median 8 x 9 x 23 mm 12 degree peek cages were packed with BMP and autograft and was inserted the interspace and countersunk appropriately along with 6 cc of morselized bone autograft. The posterolateral region was extensively decorticated and pedicle probes were placed at L4 and L5 bilaterally. Intraoperative fluoroscopy confirmed correct orientationin the AP and lateral plane. 45 x 6.5 mm pedicle screws were placed at L5 bilaterally and 45 x 6.5 mm screws placed at L4 bilaterally final x-rays demonstrated well-positioned interbody grafts and pedicle screw fixation. A 40 mm lordotic rod was placed on the right and a 40 mm rod was placed on the left locked down in situ and the posterolateral region was packed with the remaining autograft bone graft and allograft bilaterally. The wound was irrigated. Long-acting Marcaine was injected in the deep musculature.  Fascia was closed with 1 Vicryl sutures skin edges were reapproximated 2 and 3-0 Vicryl sutures. The wound is dressed with Dermabond and an occlusive dressing.   the patient was extubated in the operating room and taken to recovery in stable satisfactory condition. She tolerated the operation well counts were correct at the end of the case.   PLAN OF CARE: Admit to   inpatient   PATIENT DISPOSITION:  PACU - hemodynamically stable.   Delay start of Pharmacological VTE agent (>24hrs) due to surgical blood loss or risk of bleeding: yes  

## 2019-08-07 NOTE — Anesthesia Procedure Notes (Signed)
Procedure Name: Intubation Date/Time: 08/07/2019 12:08 PM Performed by: Lowella Dell, CRNA Pre-anesthesia Checklist: Patient identified, Emergency Drugs available, Suction available and Patient being monitored Patient Re-evaluated:Patient Re-evaluated prior to induction Oxygen Delivery Method: Circle System Utilized Preoxygenation: Pre-oxygenation with 100% oxygen Induction Type: IV induction Ventilation: Mask ventilation without difficulty and Oral airway inserted - appropriate to patient size Laryngoscope Size: Mac and 3 Grade View: Grade I Tube type: Oral Tube size: 7.0 mm Number of attempts: 1 Airway Equipment and Method: Stylet Placement Confirmation: ETT inserted through vocal cords under direct vision,  positive ETCO2 and breath sounds checked- equal and bilateral Secured at: 22 cm Tube secured with: Tape Dental Injury: Teeth and Oropharynx as per pre-operative assessment  Comments: IV induction by Dr Fransisco Beau

## 2019-08-07 NOTE — Anesthesia Postprocedure Evaluation (Signed)
Anesthesia Post Note  Patient: Natasha Rosales  Procedure(s) Performed: Lumbar Four-Five Posterior lumbar interbody fusion (N/A Back)     Anesthesia Post Evaluation  Last Vitals:  Vitals:   08/07/19 1630 08/07/19 1726  BP: 124/72 133/76  Pulse: (!) 106 (!) 103  Resp: 16 20  Temp:  36.6 C  SpO2: 94% 97%    Last Pain:  Vitals:   08/07/19 1915  TempSrc:   PainSc: 2                  Nalanie Winiecki DANIEL

## 2019-08-07 NOTE — Transfer of Care (Signed)
Immediate Anesthesia Transfer of Care Note  Patient: Natasha Rosales  Procedure(s) Performed: Lumbar Four-Five Posterior lumbar interbody fusion (N/A Back)  Patient Location: PACU  Anesthesia Type:General  Level of Consciousness: awake  Airway & Oxygen Therapy: Patient Spontanous Breathing and Patient connected to face mask oxygen  Post-op Assessment: Post -op Vital signs reviewed and stable  Post vital signs: Reviewed and stable  Last Vitals:  Vitals Value Taken Time  BP 143/80 08/07/19 1459  Temp    Pulse 99 08/07/19 1503  Resp 15 08/07/19 1503  SpO2 100 % 08/07/19 1503  Vitals shown include unvalidated device data.  Last Pain:  Vitals:   08/07/19 0958  PainSc: 0-No pain         Complications: No apparent anesthesia complications

## 2019-08-07 NOTE — Brief Op Note (Signed)
08/07/2019  3:05 PM  PATIENT:  Natasha Rosales  79 y.o. female  PRE-OPERATIVE DIAGNOSIS:  Spinal stenosis of lumbosacral region, spondylolisthesis, lumbar radiculopathy, lumbago L 45 level  POST-OPERATIVE DIAGNOSIS:  Spinal stenosis of lumbosacral region, spondylolisthesis, lumbar radiculopathy, lumbago L 45 level   PROCEDURE:  Procedure(s) with comments: Lumbar Four-Five Posterior lumbar interbody fusion (N/A) - Lumbar Four-Five Posterior lumbar interbody fusion with PEEK cages, autograft, pedicle screw fixation, posterolateral arthrodesis with autograft and allograft  SURGEON:  Surgeon(s) and Role:    Erline Levine, MD - Primary  PHYSICIAN ASSISTANT: Zada Finders, MD  ASSISTANTS: Glenford Peers, NP  ANESTHESIA:   general  EBL:  300 mL   BLOOD ADMINISTERED:none  DRAINS: none   LOCAL MEDICATIONS USED:  MARCAINE    and LIDOCAINE   SPECIMEN:  No Specimen  DISPOSITION OF SPECIMEN:  N/A  COUNTS:  YES  TOURNIQUET:  * No tourniquets in log *  DICTATION: Patient is 79 year old woman with mobile spondylolisthesis of L4 on L5 with lumbar stenosis. She has bilateral  L5 radiculopathies. It was elected to take her to surgery for decompression and fusion at this level.   Procedure: Patient was placed in a prone position on the [place] table after smooth and uncomplicated induction of general endotracheal anesthesia. Her low back was prepped and draped in usual sterile fashion with betadine and DuraPrep. Area of incision was infiltrated with local lidocaine. Incision was made to the lumbodorsal fascia was incised and exposure was performed of the L4 through L5 spinous processes laminae facet joint and transverse processes. Intraoperative x-ray was obtained which confirmed correct orientation. A total laminectomy of L4 was performed with disarticulation of the facet joints at this level and thorough decompression was performed of both L4 and L5 nerve roots along with the common dural tube. This  decompression was more involved than would be typical of that performed for PLIF alone and included painstaking dissection of adherent ligament compressing the thecal sac and wide decompression of all neural elements. A thorough discectomy was initially performed on the left with preparation of the endplates for grafting a trial spacer was placed this level and a thorough discectomy was performed on the right as well. Bone autograft was packed within the interspace bilaterally along with extra extra small BMP kit. Bilateral median 8 x 9 x 23 mm 12 degree peek cages were packed with BMP and autograft and was inserted the interspace and countersunk appropriately along with 6 cc of morselized bone autograft. The posterolateral region was extensively decorticated and pedicle probes were placed at L4 and L5 bilaterally. Intraoperative fluoroscopy confirmed correct orientationin the AP and lateral plane. 45 x 6.5 mm pedicle screws were placed at L5 bilaterally and 45 x 6.5 mm screws placed at L4 bilaterally final x-rays demonstrated well-positioned interbody grafts and pedicle screw fixation. A 40 mm lordotic rod was placed on the right and a 40 mm rod was placed on the left locked down in situ and the posterolateral region was packed with the remaining autograft bone graft and allograft bilaterally. The wound was irrigated. Long-acting Marcaine was injected in the deep musculature.  Fascia was closed with 1 Vicryl sutures skin edges were reapproximated 2 and 3-0 Vicryl sutures. The wound is dressed with Dermabond and an occlusive dressing.   the patient was extubated in the operating room and taken to recovery in stable satisfactory condition. She tolerated the operation well counts were correct at the end of the case.   PLAN OF CARE: Admit to  inpatient   PATIENT DISPOSITION:  PACU - hemodynamically stable.   Delay start of Pharmacological VTE agent (>24hrs) due to surgical blood loss or risk of bleeding: yes

## 2019-08-08 LAB — GLUCOSE, CAPILLARY: Glucose-Capillary: 248 mg/dL — ABNORMAL HIGH (ref 70–99)

## 2019-08-08 MED ORDER — OXYCODONE HCL 5 MG PO TABS: 5.0000 mg | ORAL_TABLET | ORAL | 0 refills | Status: AC | PRN

## 2019-08-08 NOTE — Evaluation (Signed)
Physical Therapy Evaluation Patient Details Name: Natasha Rosales MRN: [id number] DOB: 12-11-1940 Today's Date: 08/08/2019   History of Present Illness  Pt is a 79 y/o female s/p PLIF L4-L5. PMH including but not limited to a-fib, DM and HTN.  Clinical Impression  Pt presented sitting upright in recliner chair, awake and willing to participate in therapy session. Prior to admission, pt reported that she was independent with all functional mobility and ADLs. Pt lives with her spouse and daughter in a two level home (all bedrooms are upstairs). At the time of evaluation, pt overall moving quite well. She performed transfers with supervision, ambulated in hallway with min guard and participated in stair training with min guard as well. PT provided pt education re: back precautions, car transfers and a generalized walking program for pt to initiate upon d/c home. Pt expressed understanding. Plan is to d/c home today with family. No further acute PT needs identified at this time. PT signing off.     Follow Up Recommendations No PT follow up    Equipment Recommendations  None recommended by PT    Recommendations for Other Services       Precautions / Restrictions Precautions Precautions: Fall;Back Precaution Booklet Issued: No(OT provided) Precaution Comments: reviewed 3/3 back precautions with pt throughout Required Braces or Orthoses: Spinal Brace Spinal Brace: Lumbar corset;Applied in sitting position Restrictions Weight Bearing Restrictions: No      Mobility  Bed Mobility               General bed mobility comments: pt OOB in recliner chair upon arrival  Transfers Overall transfer level: Needs assistance Equipment used: None Transfers: Sit to/from Stand Sit to Stand: Supervision         General transfer comment: good technique utilized  Ambulation/Gait Ambulation/Gait assistance: Min Gaffer (Feet): 100 Feet Assistive device: None Gait  Pattern/deviations: Step-through pattern;Decreased stride length Gait velocity: decreased   General Gait Details: pt with mild instability but no overt LOB or need for physical assistance  Stairs Stairs: Yes Stairs assistance: Min guard Stair Management: One rail Left;Forwards;Step to pattern Number of Stairs: 2 General stair comments: min guard for safety  Wheelchair Mobility    Modified Rankin (Stroke Patients Only)       Balance Overall balance assessment: Needs assistance Sitting-balance support: Feet supported Sitting balance-Leahy Scale: Good     Standing balance support: During functional activity Standing balance-Leahy Scale: Fair                               Pertinent Vitals/Pain Pain Assessment: Faces Faces Pain Scale: Hurts a little bit Pain Location: back Pain Descriptors / Indicators: Sore Pain Intervention(s): Monitored during session;Repositioned    Home Living Family/patient expects to be discharged to:: Private residence Living Arrangements: Spouse/significant other;Children Available Help at Discharge: Family;Available 24 hours/day Type of Home: House       Home Layout: Two level Home Equipment: Walker - 2 wheels      Prior Function Level of Independence: Independent               Hand Dominance        Extremity/Trunk Assessment   Upper Extremity Assessment Upper Extremity Assessment: Defer to OT evaluation;Overall [facility] for tasks assessed    Lower Extremity Assessment Lower Extremity Assessment: Overall WFL for tasks assessed    Cervical / Trunk Assessment Cervical / Trunk Assessment: Other exceptions Cervical / Trunk Exceptions:  s/p lumbar sx  Communication   Communication: No difficulties  Cognition Arousal/Alertness: Awake/alert Behavior During Therapy: WFL for tasks assessed/performed Overall Cognitive Status: Within Functional Limits for tasks assessed                                         General Comments      Exercises     Assessment/Plan    PT Assessment Patent does not need any further PT services  PT Problem List         PT Treatment Interventions      PT Goals (Current goals can be found in the Care Plan section)  Acute Rehab PT Goals Patient Stated Goal: "home today" PT Goal Formulation: All assessment and education complete, DC therapy    Frequency     Barriers to discharge        Co-evaluation               AM-PAC PT "6 Clicks" Mobility  Outcome Measure Help needed turning from your back to your side while in a flat bed without using bedrails?: None Help needed moving from lying on your back to sitting on the side of a flat bed without using bedrails?: None Help needed moving to and from a bed to a chair (including a wheelchair)?: None Help needed standing up from a chair using your arms (e.g., wheelchair or bedside chair)?: None Help needed to walk in hospital room?: None Help needed climbing 3-5 steps with a railing? : A Little 6 Click Score: 23    End of Session Equipment Utilized During Treatment: Back brace Activity Tolerance: Patient tolerated treatment well Patient left: in chair;with call bell/phone within reach Nurse Communication: Mobility status PT Visit Diagnosis: Other abnormalities of gait and mobility (R26.89)    Time: 0973-5329 PT Time Calculation (min) (ACUTE ONLY): 21 min   Charges:   PT Evaluation $PT Eval Moderate Complexity: 1 Mod          Ginette Pitman, PT, DPT  Acute Rehabilitation Services Pager [phone] Office [phone]    Alessandra Bevels Mylie Mccurley 08/08/2019, 9:03 AM

## 2019-08-08 NOTE — Discharge Summary (Signed)
Physician Discharge Summary  Patient ID: Natasha Rosales MRN: [id number] DOB/AGE: 1940/11/19 79 y.o.  Admit date: 08/07/2019 Discharge date: 08/08/2019  Admission Diagnoses: Spondylolisthesis    Discharge Diagnoses: Same   Discharged Condition: good  Hospital Course: The patient was admitted on 08/07/2019 and taken to the operating room where the patient underwent lumbar decompression and fusion. The patient tolerated the procedure well and was taken to the recovery room and then to the floor in stable condition. The hospital course was routine. There were no complications. The wound remained clean dry and intact. Pt had appropriate back soreness. No complaints of leg pain or new N/T/W. The patient remained afebrile with stable vital signs, and tolerated a regular diet. The patient continued to increase activities, and pain was well controlled with oral pain medications.   Consults: None  Significant Diagnostic Studies:  Results for orders placed or performed during the hospital encounter of 08/07/19  Glucose, capillary  Result Value Ref Range   Glucose-Capillary 275 (H) 70 - 99 mg/dL  Glucose, capillary  Result Value Ref Range   Glucose-Capillary 187 (H) 70 - 99 mg/dL  Glucose, capillary  Result Value Ref Range   Glucose-Capillary 263 (H) 70 - 99 mg/dL   Comment 1 Notify RN    Comment 2 Document in Chart   Glucose, capillary  Result Value Ref Range   Glucose-Capillary 268 (H) 70 - 99 mg/dL   Comment 1 Notify RN    Comment 2 Document in Chart   Glucose, capillary  Result Value Ref Range   Glucose-Capillary 320 (H) 70 - 99 mg/dL   Comment 1 Notify RN    Comment 2 Document in Chart   Glucose, capillary  Result Value Ref Range   Glucose-Capillary 248 (H) 70 - 99 mg/dL   Comment 1 Notify RN    Comment 2 Document in Chart     DG Lumbar Spine 2-3 Views  Result Date: 08/07/2019 CLINICAL DATA:  Posterior fusion L4-5 EXAM: LUMBAR SPINE - 2-3 VIEW; DG C-ARM 1-60 MIN COMPARISON:   MRI 06/09/2019 FINDINGS: Multiple intraoperative spot images demonstrate changes of posterior fusion at L4-5. No hardware bony complicating feature. IMPRESSION: Posterior fusion at L4-5.  No visible complicating feature. Electronically Signed   By: Charlett Nose M.D.   On: 08/07/2019 15:17   DG C-Arm 1-60 Min  Result Date: 08/07/2019 CLINICAL DATA:  Posterior fusion L4-5 EXAM: LUMBAR SPINE - 2-3 VIEW; DG C-ARM 1-60 MIN COMPARISON:  MRI 06/09/2019 FINDINGS: Multiple intraoperative spot images demonstrate changes of posterior fusion at L4-5. No hardware bony complicating feature. IMPRESSION: Posterior fusion at L4-5.  No visible complicating feature. Electronically Signed   By: Charlett Nose M.D.   On: 08/07/2019 15:17    Antibiotics:  Anti-infectives (From admission, onward)   Start     Dose/Rate Route Frequency Ordered Stop   08/07/19 1745  ceFAZolin (ANCEF) IVPB 2g/100 mL premix     2 g 200 mL/hr over 30 Minutes Intravenous Every 8 hours 08/07/19 1732 08/08/19 0145   08/07/19 0930  ceFAZolin (ANCEF) IVPB 2g/100 mL premix     2 g 200 mL/hr over 30 Minutes Intravenous On call to O.R. 08/07/19 0925 08/07/19 1212      Discharge Exam: Blood pressure 99/60, pulse 93, temperature 98.5 F (36.9 C), temperature source Oral, resp. rate 17, height 5\' 6"  (1.676 m), weight 85.9 kg, SpO2 97 %. Neurologic: Grossly normal Dressing clean dry and intact  Discharge Medications:   Allergies as of 08/08/2019  Reactions   Codeine Nausea And Vomiting   Metoprolol Tartrate Nausea Only   Dizziness   Propoxyphene Nausea And Vomiting   Darvocet      Medication List    STOP taking these medications   HYDROcodone-acetaminophen 5-325 MG tablet Commonly known as: NORCO/VICODIN     TAKE these medications   amLODipine 5 MG tablet Commonly known as: NORVASC Take 5 mg by mouth daily.   apixaban 5 MG Tabs tablet Commonly known as: ELIQUIS Take 1 tablet (5 mg total) by mouth 2 (two) times daily.    Biotin 5000 MCG Tabs Take 5,000 mcg by mouth daily.   citalopram 40 MG tablet Commonly known as: CELEXA Take 40 mg by mouth daily.   hydrochlorothiazide 25 MG tablet Commonly known as: HYDRODIURIL Take 25 mg by mouth every morning.   lamoTRIgine 25 MG tablet Commonly known as: LAMICTAL Take 25 mg by mouth 2 (two) times daily.   Levemir FlexTouch 100 UNIT/ML FlexPen Generic drug: insulin detemir Inject 20 Units into the skin 2 (two) times daily.   losartan 50 MG tablet Commonly known as: COZAAR Take 50 mg by mouth daily.   metFORMIN 1000 MG tablet Commonly known as: GLUCOPHAGE Take 1,000 mg by mouth 2 (two) times daily.   metoprolol tartrate 25 MG tablet Commonly known as: LOPRESSOR Take 1 tablet (25 mg total) by mouth 2 (two) times daily.   oxyCODONE 5 MG immediate release tablet Commonly known as: Oxy IR/ROXICODONE Take 1 tablet (5 mg total) by mouth every 4 (four) hours as needed for severe pain.   pantoprazole 40 MG tablet Commonly known as: PROTONIX Take 40 mg by mouth daily before breakfast.   rosuvastatin 40 MG tablet Commonly known as: CRESTOR Take 40 mg by mouth daily.   Trulicity 1.5 RU/0.4VW Sopn Generic drug: Dulaglutide Inject 1.5 mg into the skin once a week.       Disposition: Home   Final Dx: Lumbar decompression and fusion  Discharge Instructions     Remove dressing in 72 hours   Complete by: As directed    Call MD for:  difficulty breathing, headache or visual disturbances   Complete by: As directed    Call MD for:  persistant nausea and vomiting   Complete by: As directed    Call MD for:  redness, tenderness, or signs of infection (pain, swelling, redness, odor or green/yellow discharge around incision site)   Complete by: As directed    Call MD for:  severe uncontrolled pain   Complete by: As directed    Call MD for:  temperature >100.4   Complete by: As directed    Diet - low sodium heart healthy   Complete by: As directed     Increase activity slowly   Complete by: As directed          Signed: Eustace Moore 08/08/2019, 8:50 AM

## 2019-08-08 NOTE — Plan of Care (Signed)
Patient alert and oriented, mae's well, voiding adequate amount of urine, swallowing without difficulty, no c/o pain at time of discharge. Patient discharged home with family. Script and discharged instructions given to patient. Patient and family stated understanding of instructions given. Patient has an appointment with Dr.Stern    

## 2019-08-08 NOTE — Discharge Instructions (Signed)
Wound Care Remove outer dressing in 2 days Leave incision open to air. You may shower. Do not scrub directly on incision.  Do not put any creams, lotions, or ointments on incision. Activity Walk each and every day, increasing distance each day. No lifting greater than a gallon of milk.  Avoid bending, arching, and twisting. No driving for 2 weeks; may ride as a passenger locally. If provided with back brace, wear when out of bed.  It is not necessary to wear in bed. Diet Resume your normal diet.   Call Your Doctor If Any of These Occur Redness, drainage, or swelling at the wound.  Temperature greater than 101 degrees. Severe pain not relieved by pain medication. Incision starts to come apart. Follow Up Appt Call today for appointment in 3-4 weeks ([phone]) or for problems.  If you have any hardware placed in your spine, you will need an x-ray before your appointment.

## 2019-08-08 NOTE — Evaluation (Signed)
Occupational Therapy Evaluation Patient Details Name: Natasha Rosales MRN: [id number] DOB: October 11, 1940 Today's Date: 08/08/2019    History of Present Illness Pt is a 79 y/o female s/p PLIF L4-L5. PMH including but not limited to a-fib, DM and HTN.   Clinical Impression   Pt PTA: living at home with spouse and daughter with Parkinson's dx. Pt was independent prior. Pt currently requiring supervisionA for all ADL. Pt donning brace without difficulty x2 times. Pt able to manage LB dressing with figure 4 tehcnique. Pt simulating walk in shower transfer in room without physical assist. Pt reports having supportive spouse and daughter who can assist. Pt education on hip kit and use of each piece.  Back handout provided and reviewed ADL in detail. Pt educated on: clothing between brace, never sleep in brace, set an alarm at night for medication, avoid sitting for long periods of time, correct bed positioning for sleeping, correct sequence for bed mobility, avoiding lifting more than 5 pounds and never wash directly over incision. All education is complete and patient indicates understanding. Pt does not require continued OT skilled services. OT signing off.      Follow Up Recommendations  No OT follow up;Supervision - Intermittent    Equipment Recommendations  None recommended by OT    Recommendations for Other Services       Precautions / Restrictions Precautions Precautions: Fall;Back Precaution Booklet Issued: No(OT provided) Precaution Comments: reviewed 3/3 back precautions with pt throughout Required Braces or Orthoses: Spinal Brace Spinal Brace: Lumbar corset;Applied in sitting position Restrictions Weight Bearing Restrictions: No      Mobility Bed Mobility Overal bed mobility: Needs Assistance Bed Mobility: Rolling;Sidelying to Sit Rolling: Supervision Sidelying to sit: Supervision       General bed mobility comments: Pt using bed rail and vebally discussed log  roll  Transfers Overall transfer level: Needs assistance Equipment used: None Transfers: Sit to/from Stand Sit to Stand: Supervision         General transfer comment: good technique utilized    Balance Overall balance assessment: Needs assistance Sitting-balance support: Feet supported Sitting balance-Leahy Scale: Good     Standing balance support: During functional activity Standing balance-Leahy Scale: Fair                             ADL either performed or assessed with clinical judgement   ADL Overall ADL's : At baseline                                       General ADL Comments: Pt requiring supervisionA for all ADL. Pt donning brace without difficulty x2 times. Pt able to manage LB dressing with figure 4 tehcnique. pt simulating walk in shower transfer in room without physical assist. Pt reports having supportive spouse and daughter who can assist. Pt education on hip kit and use of each piece.      Vision Baseline Vision/History: Wears glasses Wears Glasses: Reading only Patient Visual Report: No change from baseline Vision Assessment?: No apparent visual deficits     Perception     Praxis      Pertinent Vitals/Pain Pain Assessment: Faces Faces Pain Scale: Hurts a little bit Pain Location: back Pain Descriptors / Indicators: Sore Pain Intervention(s): Monitored during session;Premedicated before session     Hand Dominance Right   Extremity/Trunk Assessment Upper Extremity Assessment Upper Extremity  Assessment: Overall WFL for tasks assessed   Lower Extremity Assessment Lower Extremity Assessment: Overall WFL for tasks assessed   Cervical / Trunk Assessment Cervical / Trunk Assessment: Other exceptions Cervical / Trunk Exceptions: s/p lumbar sx   Communication Communication Communication: No difficulties   Cognition Arousal/Alertness: Awake/alert Behavior During Therapy: WFL for tasks assessed/performed Overall  Cognitive Status: Within Functional Limits for tasks assessed                                     General Comments  pt with afib at baseline HR up to 126 and SOB, but "this is normal for me" rest breaks taken.    Exercises     Shoulder Instructions      Home Living Family/patient expects to be discharged to:: Private residence Living Arrangements: Spouse/significant other;Children Available Help at Discharge: Family;Available 24 hours/day Type of Home: House       Home Layout: Two level Alternate Level Stairs-Number of Steps: flight Alternate Level Stairs-Rails: Right;Left           Home Equipment: Walker - 2 wheels          Prior Functioning/Environment Level of Independence: Independent                 OT Problem List:        OT Treatment/Interventions:      OT Goals(Current goals can be found in the care plan section) Acute Rehab OT Goals Patient Stated Goal: "home today" Potential to Achieve Goals: Good  OT Frequency:     Barriers to D/C:            Co-evaluation              AM-PAC OT "6 Clicks" Daily Activity     Outcome Measure Help from another person eating meals?: None Help from another person taking care of personal grooming?: None Help from another person toileting, which includes using toliet, bedpan, or urinal?: None Help from another person bathing (including washing, rinsing, drying)?: A Little Help from another person to put on and taking off regular upper body clothing?: None Help from another person to put on and taking off regular lower body clothing?: None 6 Click Score: 23   End of Session Equipment Utilized During Treatment: Rolling walker;Back brace Nurse Communication: Mobility status  Activity Tolerance: Patient tolerated treatment well Patient left: in chair;with call bell/phone within reach  OT Visit Diagnosis: Unsteadiness on feet (R26.81);Muscle weakness (generalized) (M62.81)                 Time: 0728-0800 OT Time Calculation (min): 32 min Charges:  OT General Charges $OT Visit: 1 Visit OT Evaluation $OT Eval Moderate Complexity: 1 Mod OT Treatments $Self Care/Home Management : 8-22 mins  Jefferey Pica, OTR/L Acute Rehabilitation Services Pager: [phone] Office: [phone]   Emeka Lindner C 08/08/2019, 10:33 AM

## 2019-09-16 ENCOUNTER — Ambulatory Visit: Payer: Medicare HMO | Admitting: Internal Medicine

## 2019-10-19 ENCOUNTER — Telehealth: Payer: Self-pay | Admitting: Internal Medicine

## 2019-10-19 NOTE — Telephone Encounter (Signed)
New Message   Pt c/o of Chest Pain: STAT if CP now or developed within 24 hours  1. Are you having CP right now? Some discomfort in chest with some pressure/aches, not hard pains just some aching   2. Are you experiencing any other symptoms (ex. SOB, nausea, vomiting, sweating)? none  3. How long have you been experiencing CP? 2 days  4. Is your CP continuous or coming and going? continous  5. Have you taken Nitroglycerin? No  Patient states she has some left arm aches as well.  ?

## 2019-10-19 NOTE — Telephone Encounter (Signed)
Patient called into office with complaints of chest pain. Patient has been experiencing chest pain  for 2 days. Patient states that the chest pain is in the center of her chest which is an aching feeling, and also states she is experiencing left arm pain as well. Patient denies SOB, nausea, vomiting, or diaphoresis or dizziness. Her last BP was 130/89 but has not been able to check her heart rate. She states that her grandson checked her heart rate yesterday with his apple watch and she reported that she was in afib which she has a history of. Patient states that the chest pain is coming and going but lasting about an hour or so at a time. She states that the chest pain is the same regardless of being at rest or with activity. Patient was advised that she needed to be seen at her local emergency department. Patient verbalized understanding. Will forward this message to Dr. Rennis Golden.

## 2019-10-19 NOTE — Telephone Encounter (Signed)
Thanks, I agree.  Dr Rexene Edison

## 2019-11-12 ENCOUNTER — Ambulatory Visit: Payer: Medicare HMO | Admitting: Internal Medicine

## 2020-01-04 ENCOUNTER — Ambulatory Visit: Payer: Medicare HMO | Admitting: Internal Medicine

## 2020-08-31 ENCOUNTER — Institutional Professional Consult (permissible substitution): Payer: Medicare HMO | Admitting: Pulmonary Disease

## 2020-09-08 ENCOUNTER — Institutional Professional Consult (permissible substitution): Payer: Medicare HMO | Admitting: Pulmonary Disease

## 2020-09-14 ENCOUNTER — Institutional Professional Consult (permissible substitution): Payer: Medicare HMO | Admitting: Pulmonary Disease

## 2020-10-27 ENCOUNTER — Institutional Professional Consult (permissible substitution): Payer: Medicare HMO | Admitting: Pulmonary Disease

## 2020-11-16 IMAGING — RF DG LUMBAR SPINE 2-3V
1 series · 7 of 7 positions shown · non-contrast
Comparison: MRI 06/09/2019

CLINICAL DATA: Posterior fusion L4-5

EXAM:
LUMBAR SPINE - 2-3 VIEW; DG C-ARM 1-60 MIN

[Series 1: run · 7 of 7 slices shown]
[im 1/7]
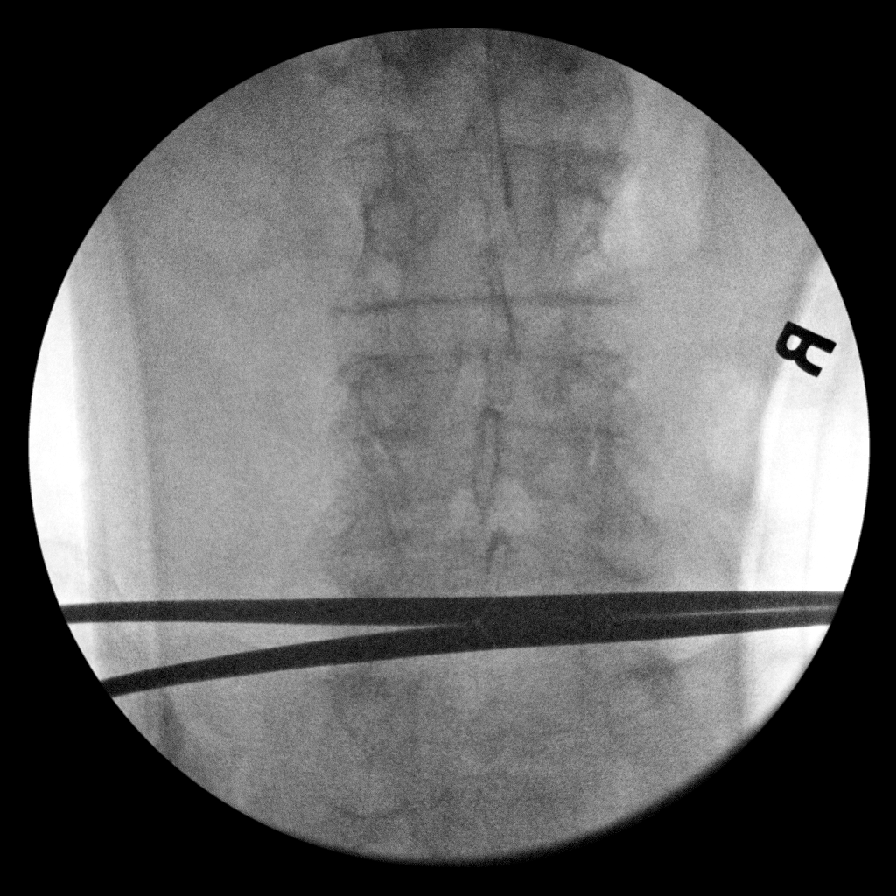
[im 2/7]
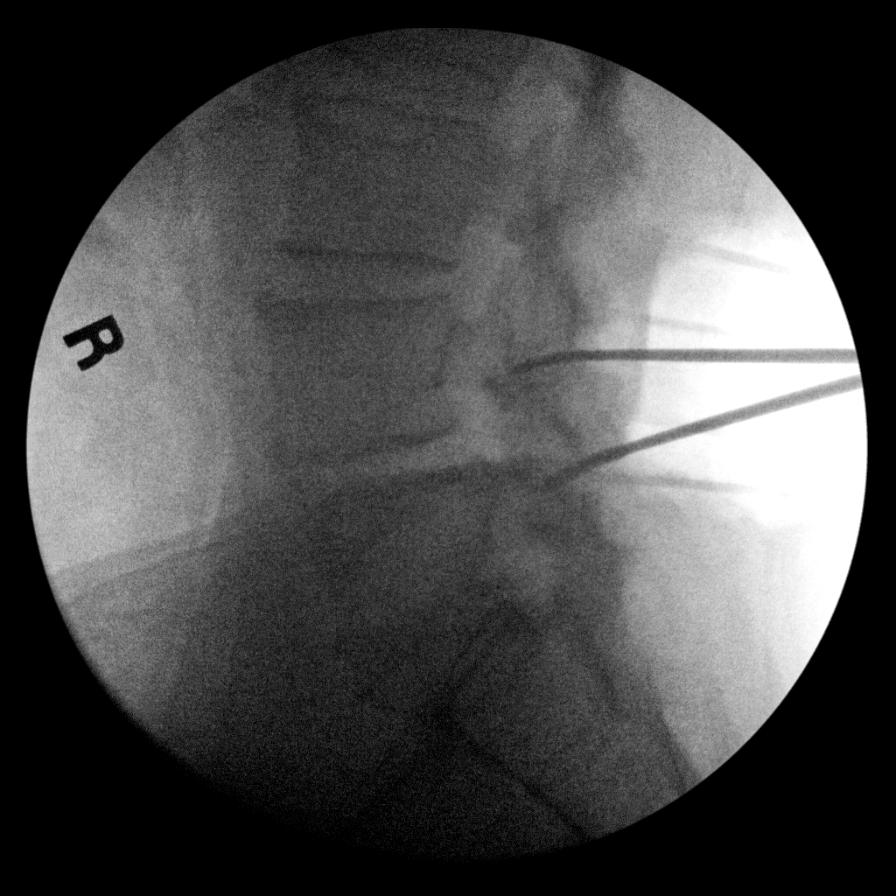
[im 3/7]
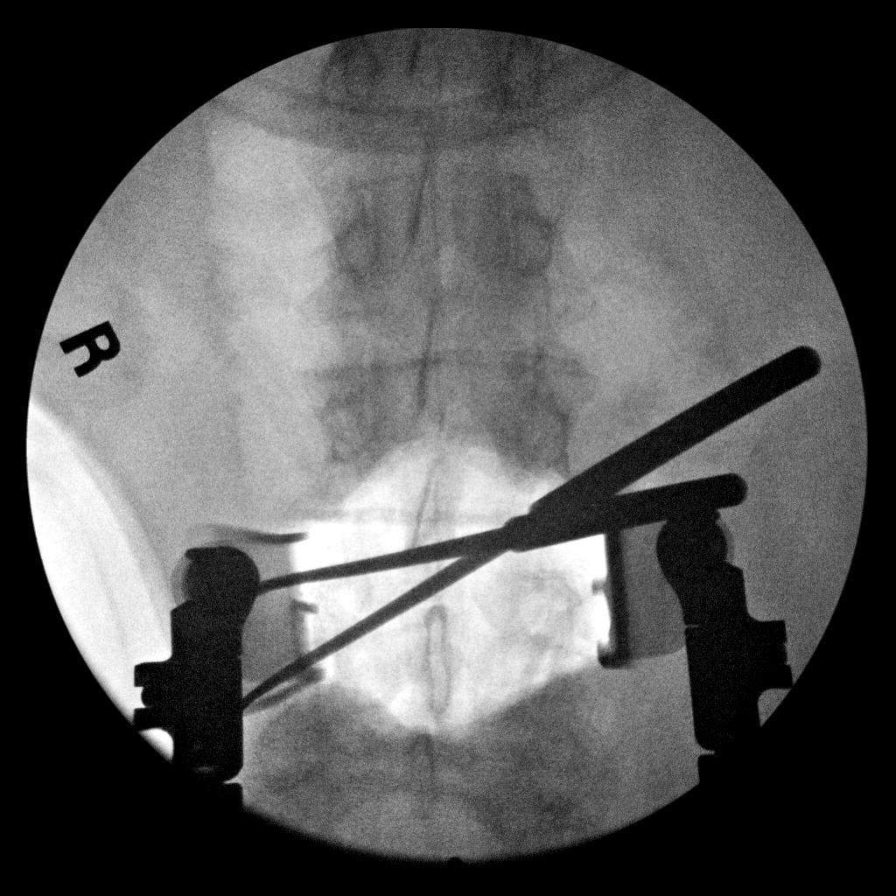
[im 4/7]
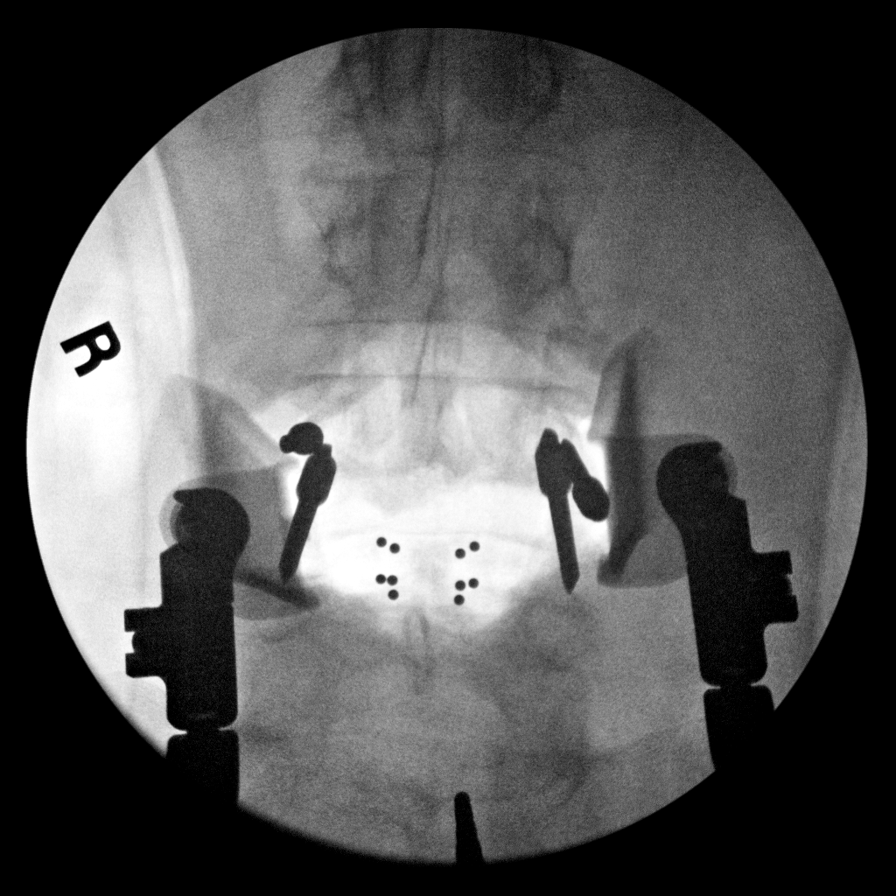
[im 5/7]
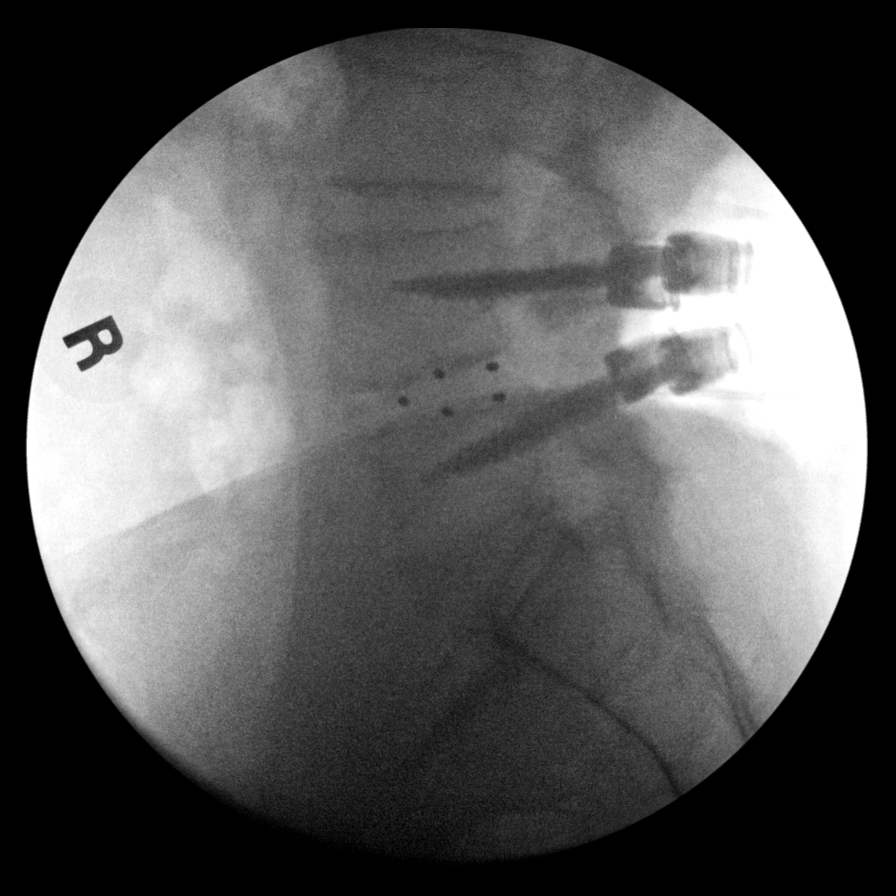
[im 6/7]
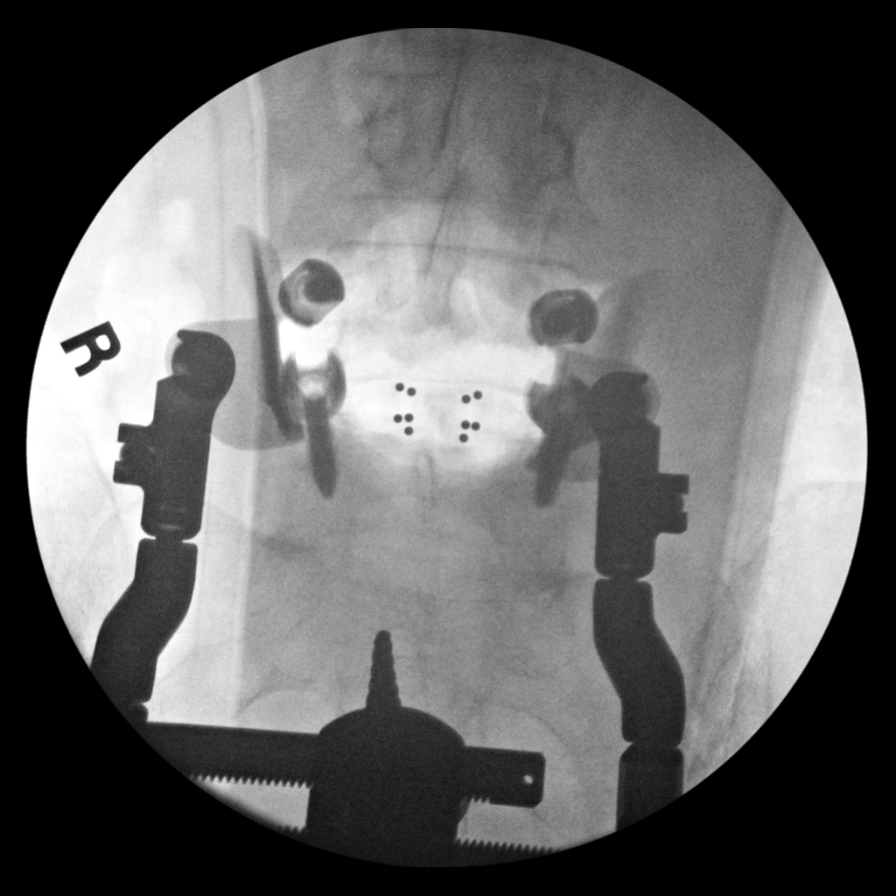
[im 7/7]
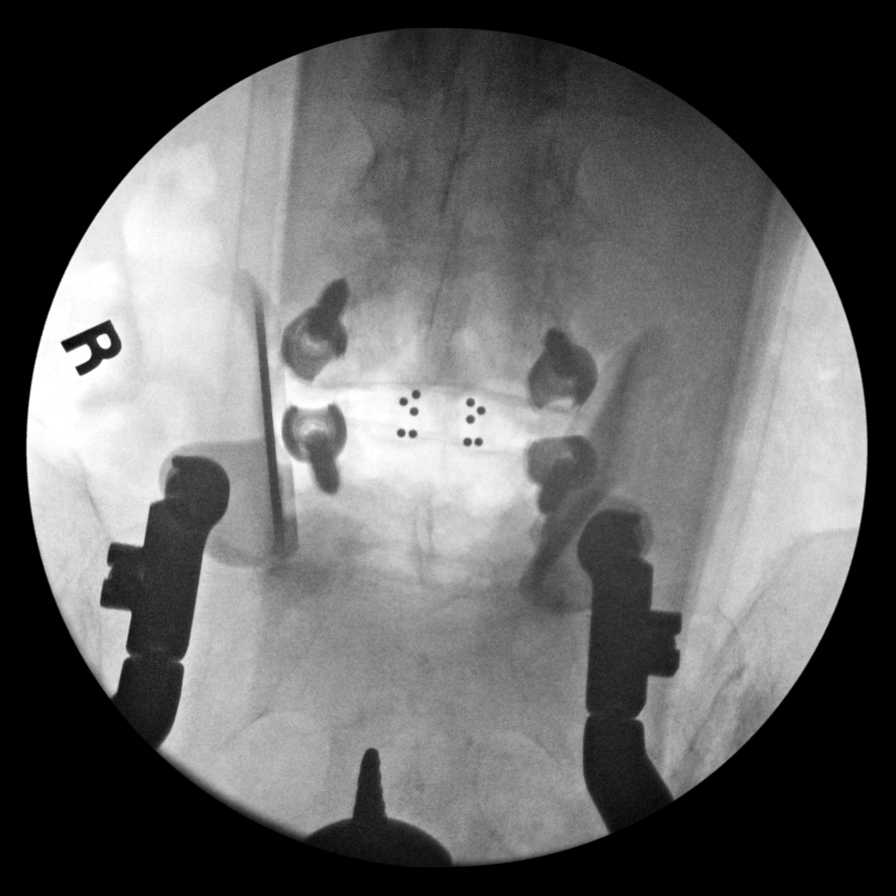

[7 of 7 positions shown; findings below may reference images not displayed]

FINDINGS: Multiple intraoperative spot images demonstrate changes of posterior
fusion at L4-5. No hardware bony complicating feature.
IMPRESSION: Posterior fusion at L4-5.  No visible complicating feature.

## 2020-11-23 ENCOUNTER — Institutional Professional Consult (permissible substitution): Payer: Medicare HMO | Admitting: Pulmonary Disease

## 2020-11-23 NOTE — Progress Notes (Deleted)
Synopsis: Referred in *** for *** by Chapell, Waldron Session, NP  Subjective:   PATIENT ID: Natasha Rosales GENDER: female DOB: [birth year], MRN: [id number]  No chief complaint on file.   HPI  ***  Past Medical History:  Diagnosis Date   Anxiety    Arthritis    Depression    Diabetes mellitus without complication (HCC)    type 2   Diverticulitis    GERD (gastroesophageal reflux disease)    History of kidney stones    Hypertension    PONV (postoperative nausea and vomiting)    Sleep apnea    does not tolerate Cpap     No family history on file.   Past Surgical History:  Procedure Laterality Date   ABDOMINAL HYSTERECTOMY     CHOLECYSTECTOMY     COLONOSCOPY     KNEE ARTHROSCOPY Left    TONSILLECTOMY      Social History   Socioeconomic History   Marital status: Married    Spouse name: Not on file   Number of children: Not on file   Years of education: Not on file   Highest education level: Not on file  Occupational History   Not on file  Tobacco Use   Smoking status: Never   Smokeless tobacco: Never  Vaping Use   Vaping Use: Never used  Substance and Sexual Activity   Alcohol use: Never   Drug use: Never   Sexual activity: Not on file  Other Topics Concern   Not on file  Social History Narrative   Not on file   Social Determinants of Health   Financial Resource Strain: Not on file  Food Insecurity: Not on file  Transportation Needs: Not on file  Physical Activity: Not on file  Stress: Not on file  Social Connections: Not on file  Intimate Partner Violence: Not on file     Allergies  Allergen Reactions   Codeine Nausea And Vomiting   Metoprolol Tartrate Nausea Only    Dizziness   Propoxyphene Nausea And Vomiting    Darvocet     Outpatient Medications Prior to Visit  Medication Sig Dispense Refill   amLODipine (NORVASC) 5 MG tablet Take 5 mg by mouth daily.     apixaban (ELIQUIS) 5 MG TABS tablet Take 1 tablet (5 mg total) by mouth 2 (two)  times daily. 60 tablet 0   Biotin 5000 MCG TABS Take 5,000 mcg by mouth daily.     citalopram (CELEXA) 40 MG tablet Take 40 mg by mouth daily.     hydrochlorothiazide (HYDRODIURIL) 25 MG tablet Take 25 mg by mouth every morning.     lamoTRIgine (LAMICTAL) 25 MG tablet Take 25 mg by mouth 2 (two) times daily.     LEVEMIR FLEXTOUCH 100 UNIT/ML Pen Inject 20 Units into the skin 2 (two) times daily.      losartan (COZAAR) 50 MG tablet Take 50 mg by mouth daily.     metFORMIN (GLUCOPHAGE) 1000 MG tablet Take 1,000 mg by mouth 2 (two) times daily.     metoprolol tartrate (LOPRESSOR) 25 MG tablet Take 1 tablet (25 mg total) by mouth 2 (two) times daily. (Patient not taking: Reported on 07/24/2019) 180 tablet 3   oxyCODONE (OXY IR/ROXICODONE) 5 MG immediate release tablet Take 1 tablet (5 mg total) by mouth every 4 (four) hours as needed for severe pain. 40 tablet 0   pantoprazole (PROTONIX) 40 MG tablet Take 40 mg by mouth daily before breakfast.  rosuvastatin (CRESTOR) 40 MG tablet Take 40 mg by mouth daily.      TRULICITY 1.5 MG/0.5ML SOPN Inject 1.5 mg into the skin once a week.      No facility-administered medications prior to visit.    ROS   Objective:  Physical Exam   There were no vitals filed for this visit.   on *** LPM *** RA BMI Readings from Last 3 Encounters:  08/07/19 30.57 kg/m  08/05/19 30.55 kg/m  07/15/19 30.25 kg/m   Wt Readings from Last 3 Encounters:  08/07/19 189 lb 6 oz (85.9 kg)  08/05/19 189 lb 4.8 oz (85.9 kg)  07/15/19 187 lb 6.4 oz (85 kg)     CBC    Component Value Date/Time   WBC 10.9 (H) 08/05/2019 1415   RBC 4.82 08/05/2019 1415   HGB 14.9 08/05/2019 1415   HCT 45.1 08/05/2019 1415   PLT 283 08/05/2019 1415   MCV 93.6 08/05/2019 1415   MCH 30.9 08/05/2019 1415   MCHC 33.0 08/05/2019 1415   RDW 13.4 08/05/2019 1415    ***  Chest Imaging: ***  Pulmonary Functions Testing Results: No flowsheet data found.  FeNO: ***  Pathology:  ***  Echocardiogram: ***  Heart Catheterization: ***    Assessment & Plan:   No diagnosis found.  Discussion: ***   Current Outpatient Medications:    amLODipine (NORVASC) 5 MG tablet, Take 5 mg by mouth daily., Disp: , Rfl:    apixaban (ELIQUIS) 5 MG TABS tablet, Take 1 tablet (5 mg total) by mouth 2 (two) times daily., Disp: 60 tablet, Rfl: 0   Biotin 5000 MCG TABS, Take 5,000 mcg by mouth daily., Disp: , Rfl:    citalopram (CELEXA) 40 MG tablet, Take 40 mg by mouth daily., Disp: , Rfl:    hydrochlorothiazide (HYDRODIURIL) 25 MG tablet, Take 25 mg by mouth every morning., Disp: , Rfl:    lamoTRIgine (LAMICTAL) 25 MG tablet, Take 25 mg by mouth 2 (two) times daily., Disp: , Rfl:    LEVEMIR FLEXTOUCH 100 UNIT/ML Pen, Inject 20 Units into the skin 2 (two) times daily. , Disp: , Rfl:    losartan (COZAAR) 50 MG tablet, Take 50 mg by mouth daily., Disp: , Rfl:    metFORMIN (GLUCOPHAGE) 1000 MG tablet, Take 1,000 mg by mouth 2 (two) times daily., Disp: , Rfl:    metoprolol tartrate (LOPRESSOR) 25 MG tablet, Take 1 tablet (25 mg total) by mouth 2 (two) times daily. (Patient not taking: Reported on 07/24/2019), Disp: 180 tablet, Rfl: 3   oxyCODONE (OXY IR/ROXICODONE) 5 MG immediate release tablet, Take 1 tablet (5 mg total) by mouth every 4 (four) hours as needed for severe pain., Disp: 40 tablet, Rfl: 0   pantoprazole (PROTONIX) 40 MG tablet, Take 40 mg by mouth daily before breakfast., Disp: , Rfl:    rosuvastatin (CRESTOR) 40 MG tablet, Take 40 mg by mouth daily. , Disp: , Rfl:    TRULICITY 1.5 MG/0.5ML SOPN, Inject 1.5 mg into the skin once a week. , Disp: , Rfl:   I spent *** minutes dedicated to the care of this patient on the date of this encounter to include pre-visit review of records, face-to-face time with the patient discussing conditions above, post visit ordering of testing, clinical documentation with the electronic health record, making appropriate referrals as documented, and  communicating necessary findings to members of the patients care team.   Josephine Igo, DO [facility] 11/23/2020 8:53 AM

## 2020-12-13 ENCOUNTER — Institutional Professional Consult (permissible substitution): Payer: Medicare HMO | Admitting: Emergency Medicine

## 2020-12-29 ENCOUNTER — Institutional Professional Consult (permissible substitution): Payer: Medicare HMO | Admitting: Emergency Medicine

## 2021-01-06 ENCOUNTER — Institutional Professional Consult (permissible substitution): Payer: Medicare HMO | Admitting: Emergency Medicine

## 2021-10-09 ENCOUNTER — Telehealth: Payer: Self-pay

## 2021-10-09 NOTE — Telephone Encounter (Signed)
   Primary Cardiologist: Chrystie Nose, MD  Chart reviewed as part of pre-operative protocol coverage. Simple dental extractions are considered low risk procedures per guidelines and generally do not require any specific cardiac clearance. It is also generally accepted that for simple extractions and dental cleanings, there is no need to interrupt blood thinner therapy.   SBE prophylaxis is not required for the patient.  I will route this recommendation to the requesting party via Epic fax function and remove from pre-op pool.  Please call with questions.  Levi Aland, NP-C    10/09/2021, 12:25 PM [facility] [address], [address] Office [phone] Fax [phone]

## 2021-10-09 NOTE — Telephone Encounter (Signed)
   Pre-operative Risk Assessment    Patient Name: Natasha Rosales  DOB: Aug 14, 1940 MRN: [id number]     Request for Surgical Clearance    Procedure:  Dental Extraction - Amount of Teeth to be Pulled:  1  Date of Surgery:  Clearance TBD                 Surgeon:  Dr. Marcelline Mates  Surgeon's Group or Practice Name:  [facility]  Phone number:  [phone] Fax number:  [phone]   Type of Clearance Requested:   - Medical  - Pharmacy:  Hold Apixaban (Eliquis)     Type of Anesthesia:  Not Indicated   Additional requests/questions:    Deforest Hoyles   10/09/2021, 11:48 AM

## 2022-01-05 ENCOUNTER — Ambulatory Visit: Payer: Medicare HMO | Attending: Internal Medicine | Admitting: Internal Medicine

## 2022-01-15 ENCOUNTER — Encounter: Payer: Self-pay | Admitting: Internal Medicine

## 2022-01-30 ENCOUNTER — Ambulatory Visit: Payer: Medicare HMO | Admitting: Internal Medicine
